# Patient Record
Sex: Female | Born: 1995 | Race: Black or African American | Hispanic: No | Marital: Single | Smoking: Never smoker
Health system: Southern US, Community
[De-identification: ages and names within clinical notes are randomized; demographics above are authoritative.]

## PROBLEM LIST (undated history)

## (undated) DIAGNOSIS — R569 Unspecified convulsions: Secondary | ICD-10-CM

## (undated) DIAGNOSIS — R519 Headache, unspecified: Secondary | ICD-10-CM

## (undated) DIAGNOSIS — Q796 Ehlers-Danlos syndrome, unspecified: Secondary | ICD-10-CM

## (undated) DIAGNOSIS — A749 Chlamydial infection, unspecified: Secondary | ICD-10-CM

## (undated) DIAGNOSIS — J45909 Unspecified asthma, uncomplicated: Secondary | ICD-10-CM

## (undated) DIAGNOSIS — M797 Fibromyalgia: Secondary | ICD-10-CM

## (undated) HISTORY — DX: Ehlers-Danlos syndrome, unspecified: Q79.60

## (undated) HISTORY — PX: TONSILLECTOMY: SUR1361

## (undated) HISTORY — DX: Chlamydial infection, unspecified: A74.9

---

## 2018-08-28 NOTE — L&D Delivery Note (Signed)
Delivery Note At 8:37 AM a viable female was delivered via Vaginal, Spontaneous (Presentation: ;  ).  APGAR: 8, 9; weight  .   Placenta status: intact with 3 vessel cord , Cord:  with the following complications: nuchal cord, loose, x 1  Anesthesia:   Epidural and local Episiotomy: None Lacerations: 2nd degree midline and bilateral 1st degree periurethral Suture Repair: 3.0 monocryl Est. Blood Loss (mL):    Mom to postpartum.  Baby to Couplet care / Skin to Skin (Patient said that she spoke with Peds about her COVID status and she opted to keep the baby with her).  Emily Filbert 02/13/2019, 9:01 AM

## 2019-02-11 ENCOUNTER — Other Ambulatory Visit: Payer: Self-pay

## 2019-02-11 ENCOUNTER — Inpatient Hospital Stay (EMERGENCY_DEPARTMENT_HOSPITAL)
Admission: AD | Admit: 2019-02-11 | Discharge: 2019-02-11 | Disposition: A | Discharge status: Home / Self Care | Payer: Medicare Other | Source: Home / Self Care | Attending: Family Medicine | Admitting: Family Medicine

## 2019-02-11 ENCOUNTER — Encounter (HOSPITAL_COMMUNITY): Payer: Self-pay | Admitting: *Deleted

## 2019-02-11 DIAGNOSIS — Z3A39 39 weeks gestation of pregnancy: Secondary | ICD-10-CM

## 2019-02-11 DIAGNOSIS — Z3A40 40 weeks gestation of pregnancy: Secondary | ICD-10-CM | POA: Insufficient documentation

## 2019-02-11 DIAGNOSIS — O471 False labor at or after 37 completed weeks of gestation: Secondary | ICD-10-CM | POA: Insufficient documentation

## 2019-02-11 DIAGNOSIS — O26893 Other specified pregnancy related conditions, third trimester: Secondary | ICD-10-CM | POA: Diagnosis not present

## 2019-02-11 DIAGNOSIS — O479 False labor, unspecified: Secondary | ICD-10-CM

## 2019-02-11 HISTORY — DX: Fibromyalgia: M79.7

## 2019-02-11 HISTORY — DX: Headache, unspecified: R51.9

## 2019-02-11 HISTORY — DX: Unspecified asthma, uncomplicated: J45.909

## 2019-02-11 HISTORY — DX: Unspecified convulsions: R56.9

## 2019-02-11 LAB — GROUP B STREP BY PCR: Group B strep by PCR: NEGATIVE

## 2019-02-11 LAB — URINALYSIS, ROUTINE W REFLEX MICROSCOPIC
Bilirubin Urine: NEGATIVE
Glucose, UA: NEGATIVE mg/dL
Ketones, ur: NEGATIVE mg/dL
Nitrite: NEGATIVE
Protein, ur: NEGATIVE mg/dL
Specific Gravity, Urine: 1.002 — ABNORMAL LOW (ref 1.005–1.030)
pH: 7 (ref 5.0–8.0)

## 2019-02-11 NOTE — MAU Note (Signed)
Pt reports to MAU via EMS c/o cramping every 10 min. No LOF. Pt reports only seeing some light brown discharge once when she wiped today. +FM. Pt from [place] reports she was here visiting. Pt reports that the people she was staying with last month had tested POS for COVID. Pt reports she had coughing then but none now. Pt states she took cough drops and another medication for coughing. Pt denies any other symptoms.

## 2019-02-11 NOTE — Discharge Instructions (Signed)
Braxton Hicks Contractions °Contractions of the uterus can occur throughout pregnancy, but they are not always a sign that you are in labor. You may have practice contractions called Braxton Hicks contractions. These false labor contractions are sometimes confused with true labor. °What are Braxton Hicks contractions? °Braxton Hicks contractions are tightening movements that occur in the muscles of the uterus before labor. Unlike true labor contractions, these contractions do not result in opening (dilation) and thinning of the cervix. Toward the end of pregnancy (32-34 weeks), Braxton Hicks contractions can happen more often and may become stronger. These contractions are sometimes difficult to tell apart from true labor because they can be very uncomfortable. You should not feel embarrassed if you go to the hospital with false labor. °Sometimes, the only way to tell if you are in true labor is for your health care provider to look for changes in the cervix. The health care provider will do a physical exam and may monitor your contractions. If you are not in true labor, the exam should show that your cervix is not dilating and your water has not broken. °If there are no other health problems associated with your pregnancy, it is completely safe for you to be sent home with false labor. You may continue to have Braxton Hicks contractions until you go into true labor. °How to tell the difference between true labor and false labor °True labor °· Contractions last 30-70 seconds. °· Contractions become very regular. °· Discomfort is usually felt in the top of the uterus, and it spreads to the lower abdomen and low back. °· Contractions do not go away with walking. °· Contractions usually become more intense and increase in frequency. °· The cervix dilates and gets thinner. °False labor °· Contractions are usually shorter and not as strong as true labor contractions. °· Contractions are usually irregular. °· Contractions  are often felt in the front of the lower abdomen and in the groin. °· Contractions may go away when you walk around or change positions while lying down. °· Contractions get weaker and are shorter-lasting as time goes on. °· The cervix usually does not dilate or become thin. °Follow these instructions at home: ° °· Take over-the-counter and prescription medicines only as told by your health care provider. °· Keep up with your usual exercises and follow other instructions from your health care provider. °· Eat and drink lightly if you think you are going into labor. °· If Braxton Hicks contractions are making you uncomfortable: °? Change your position from lying down or resting to walking, or change from walking to resting. °? Sit and rest in a tub of warm water. °? Drink enough fluid to keep your urine pale yellow. Dehydration may cause these contractions. °? Do slow and deep breathing several times an hour. °· Keep all follow-up prenatal visits as told by your health care provider. This is important. °Contact a health care provider if: °· You have a fever. °· You have continuous pain in your abdomen. °Get help right away if: °· Your contractions become stronger, more regular, and closer together. °· You have fluid leaking or gushing from your vagina. °· You pass blood-tinged mucus (bloody show). °· You have bleeding from your vagina. °· You have low back pain that you never had before. °· You feel your baby’s head pushing down and causing pelvic pressure. °· Your baby is not moving inside you as much as it used to. °Summary °· Contractions that occur before labor are   called Braxton Hicks contractions, false labor, or practice contractions.  Braxton Hicks contractions are usually shorter, weaker, farther apart, and less regular than true labor contractions. True labor contractions usually become progressively stronger and regular, and they become more frequent.  [facility] from [facility]  by changing position, resting in a warm bath, drinking plenty of water, or practicing deep breathing. This information is not intended to replace advice given to you by your health care provider. Make sure you discuss any questions you have with your health care provider. Document Released: 12/28/2016 Document Revised: 05/29/2017 Document Reviewed: 12/28/2016 Elsevier Interactive Patient Education  2019 [employer] Inc[employer]  Vaginal Delivery  Vaginal delivery means that you give birth by pushing your baby out of your birth canal (vagina). A team of health care providers will help you before, during, and after vaginal delivery. Birth experiences are unique for every woman and every pregnancy, and birth experiences vary depending on where you choose to give birth. What happens when I arrive at the birth center or hospital? Once you are in labor and have been admitted into the hospital or birth center, your health care provider may:  Review your pregnancy history and any concerns that you have.  Insert an IV into one of your veins. This may be used to give you fluids and medicines.  Check your blood pressure, pulse, temperature, and heart rate (vital signs).  Check whether your bag of water (amniotic sac) has broken (ruptured).  Talk with you about your birth plan and discuss pain control options. Monitoring Your health care provider may monitor your contractions (uterine monitoring) and your baby's heart rate (fetal monitoring). You may need to be monitored:  Often, but not continuously (intermittently).  All the time or for long periods at a time (continuously). Continuous monitoring may be needed if: ? You are taking certain medicines, such as medicine to relieve pain or make your contractions stronger. ? You have pregnancy or labor complications. Monitoring may be done by:  Placing a special stethoscope or a handheld monitoring device on your abdomen to check your baby's heartbeat and to  check for contractions.  Placing monitors on your abdomen (external monitors) to record your baby's heartbeat and the frequency and length of contractions.  Placing monitors inside your uterus through your vagina (internal monitors) to record your baby's heartbeat and the frequency, length, and strength of your contractions. Depending on the type of monitor, it may remain in your uterus or on your baby's head until birth.  Telemetry. This is a type of continuous monitoring that can be done with external or internal monitors. Instead of having to stay in bed, you are able to move around during telemetry. Physical exam Your health care provider may perform frequent physical exams. This may include:  Checking how and where your baby is positioned in your uterus.  Checking your cervix to determine: ? Whether it is thinning out (effacing). ? Whether it is opening up (dilating). What happens during labor and delivery?  Normal labor and delivery is divided into the following three stages: Stage 1  This is the longest stage of labor.  This stage can last for hours or days.  Throughout this stage, you will feel contractions. Contractions generally feel mild, infrequent, and irregular at first. They get stronger, more frequent (about every 2-3 minutes), and more regular as you move through this stage.  This stage ends when your cervix is completely dilated to 4 inches (10 cm) and completely effaced.  Stage 2  This stage starts once your cervix is completely effaced and dilated and lasts until the delivery of your baby.  This stage may last from 20 minutes to 2 hours.  This is the stage where you will feel an urge to push your baby out of your vagina.  You may feel stretching and burning pain, especially when the widest part of your baby's head passes through the vaginal opening (crowning).  Once your baby is delivered, the umbilical cord will be clamped and cut. This usually occurs after  waiting a period of 1-2 minutes after delivery.  Your baby will be placed on your bare chest (skin-to-skin contact) in an upright position and covered with a warm blanket. Watch your baby for feeding cues, like rooting or sucking, and help the baby to your breast for his or her first feeding. Stage 3  This stage starts immediately after the birth of your baby and ends after you deliver the placenta.  This stage may take anywhere from 5 to 30 minutes.  After your baby has been delivered, you will feel contractions as your body expels the placenta and your uterus contracts to control bleeding. What can I expect after labor and delivery?  After labor is over, you and your baby will be monitored closely until you are ready to go home to ensure that you are both healthy. Your health care team will teach you how to care for yourself and your baby.  You and your baby will stay in the same room (rooming in) during your hospital stay. This will encourage early bonding and successful breastfeeding.  You may continue to receive fluids and medicines through an IV.  Your uterus will be checked and massaged regularly (fundal massage).  You will have some soreness and pain in your abdomen, vagina, and the area of skin between your vaginal opening and your anus (perineum).  If an incision was made near your vagina (episiotomy) or if you had some vaginal tearing during delivery, cold compresses may be placed on your episiotomy or your tear. This helps to reduce pain and swelling.  You may be given a squirt bottle to use instead of wiping when you go to the bathroom. To use the squirt bottle, follow these steps: ? Before you urinate, fill the squirt bottle with warm water. Do not use hot water. ? After you urinate, while you are sitting on the toilet, use the squirt bottle to rinse the area around your urethra and vaginal opening. This rinses away any urine and blood. ? Fill the squirt bottle with clean  water every time you use the bathroom.  It is normal to have vaginal bleeding after delivery. Wear a sanitary pad for vaginal bleeding and discharge. Summary  Vaginal delivery means that you will give birth by pushing your baby out of your birth canal (vagina).  Your health care provider may monitor your contractions (uterine monitoring) and your baby's heart rate (fetal monitoring).  Your health care provider may perform a physical exam.  Normal labor and delivery is divided into three stages.  After labor is over, you and your baby will be monitored closely until you are ready to go home. This information is not intended to replace advice given to you by your health care provider. Make sure you discuss any questions you have with your health care provider. Document Released: 05/23/2008 Document Revised: 09/18/2017 Document Reviewed: 09/18/2017 Elsevier Interactive Patient Education  2019 [employer].  Fetal Movement Counts Patient  Name: ________________________________________________ Patient Due Date: ____________________ What is a fetal movement count?  A fetal movement count is the number of times that you feel your baby move during a certain amount of time. This may also be called a fetal kick count. A fetal movement count is recommended for every pregnant woman. You may be asked to start counting fetal movements as early as week 28 of your pregnancy. Pay attention to when your baby is most active. You may notice your baby's sleep and wake cycles. You may also notice things that make your baby move more. You should do a fetal movement count:  When your baby is normally most active.  At the same time each day. A good time to count movements is while you are resting, after having something to eat and drink. How do I count fetal movements? 1. Find a quiet, comfortable area. Sit, or lie down on your side. 2. Write down the date, the start time and stop time, and the number of  movements that you felt between those two times. Take this information with you to your health care visits. 3. For 2 hours, count kicks, flutters, swishes, rolls, and jabs. You should feel at least 10 movements during 2 hours. 4. You may stop counting after you have felt 10 movements. 5. If you do not feel 10 movements in 2 hours, have something to eat and drink. Then, keep resting and counting for 1 hour. If you feel at least 4 movements during that hour, you may stop counting. Contact a health care provider if:  You feel fewer than 4 movements in 2 hours.  Your baby is not moving like he or she usually does. Date: ____________ Start time: ____________ Stop time: ____________ Movements: ____________ Date: ____________ Start time: ____________ Stop time: ____________ Movements: ____________ Date: ____________ Start time: ____________ Stop time: ____________ Movements: ____________ Date: ____________ Start time: ____________ Stop time: ____________ Movements: ____________ Date: ____________ Start time: ____________ Stop time: ____________ Movements: ____________ Date: ____________ Start time: ____________ Stop time: ____________ Movements: ____________ Date: ____________ Start time: ____________ Stop time: ____________ Movements: ____________ Date: ____________ Start time: ____________ Stop time: ____________ Movements: ____________ Date: ____________ Start time: ____________ Stop time: ____________ Movements: ____________ This information is not intended to replace advice given to you by your health care provider. Make sure you discuss any questions you have with your health care provider. Document Released: 09/13/2006 Document Revised: 04/12/2016 Document Reviewed: 09/23/2015 Elsevier Interactive Patient Education  2019 [employer]

## 2019-02-11 NOTE — MAU Note (Signed)
I have communicated with Knute Neu CNM and reviewed vital signs:  Vitals:   02/11/19 2112 02/11/19 2159  BP: 133/71   Pulse: 75   Resp: 18   Temp: 98.5 F (36.9 C) 98.5 F (36.9 C)    Vaginal exam:  Dilation: 1.5 Effacement (%): 60 Station: -3 Exam by:: l.Claudy Abdallah rn,   Also reviewed contraction pattern and that non-stress test is reactive.  It has been documented that patient is not contracting.  No second cervical check needed.  Patient denies any other complaints.  Based on this report provider has given order for discharge.  A discharge order and diagnosis entered by a provider.   Labor discharge instructions reviewed with patient.     Pt advised to follow up with the clinic. Pt from [place] and plans to deliver here in [place]. Pt advised she needs to get a copy of her prenatals and fax them to the clinic. Pt given labor precautions. Provider notified of recent COVID exposure 1 month ago.

## 2019-02-11 NOTE — MAU Provider Note (Signed)
Sherry Colon is a 23 y.o. G1P0 female at [redacted]w[redacted]d visiting from out of town, although she plans to stay.  RN Labor check, not seen by provider SVE by RN: Dilation: 1.5 Effacement (%): 60 Station: -3 Exam by:: l.cox rn NST: FHR baseline 155 bpm, Variability: moderate, Accelerations:present, Decelerations:  Absent= Cat 1/Reactive Toco: irregular GBS, gc/ct collected  D/C home, message sent to clinic to schedule appt this week  [place], [facility] 02/11/2019 9:52 PM

## 2019-02-12 ENCOUNTER — Inpatient Hospital Stay (HOSPITAL_COMMUNITY)
Admission: EM | Admit: 2019-02-12 | Discharge: 2019-02-15 | DRG: 805 | Disposition: A | Discharge status: Home / Self Care | Payer: Medicare Other | Attending: Obstetrics and Gynecology | Admitting: Obstetrics and Gynecology

## 2019-02-12 ENCOUNTER — Encounter (HOSPITAL_COMMUNITY): Payer: Self-pay | Admitting: Anesthesiology

## 2019-02-12 ENCOUNTER — Inpatient Hospital Stay (HOSPITAL_COMMUNITY): Payer: Medicare Other | Admitting: Anesthesiology

## 2019-02-12 ENCOUNTER — Other Ambulatory Visit: Payer: Self-pay

## 2019-02-12 ENCOUNTER — Encounter: Payer: Self-pay | Admitting: Medical

## 2019-02-12 ENCOUNTER — Encounter (HOSPITAL_COMMUNITY): Payer: Self-pay | Admitting: *Deleted

## 2019-02-12 DIAGNOSIS — O98219 Gonorrhea complicating pregnancy, unspecified trimester: Secondary | ICD-10-CM | POA: Diagnosis present

## 2019-02-12 DIAGNOSIS — J45909 Unspecified asthma, uncomplicated: Secondary | ICD-10-CM | POA: Diagnosis present

## 2019-02-12 DIAGNOSIS — Z3689 Encounter for other specified antenatal screening: Secondary | ICD-10-CM | POA: Diagnosis not present

## 2019-02-12 DIAGNOSIS — G43909 Migraine, unspecified, not intractable, without status migrainosus: Secondary | ICD-10-CM | POA: Diagnosis present

## 2019-02-12 DIAGNOSIS — O98819 Other maternal infectious and parasitic diseases complicating pregnancy, unspecified trimester: Secondary | ICD-10-CM | POA: Diagnosis present

## 2019-02-12 DIAGNOSIS — O99354 Diseases of the nervous system complicating childbirth: Secondary | ICD-10-CM | POA: Diagnosis present

## 2019-02-12 DIAGNOSIS — Z3490 Encounter for supervision of normal pregnancy, unspecified, unspecified trimester: Secondary | ICD-10-CM

## 2019-02-12 DIAGNOSIS — O9852 Other viral diseases complicating childbirth: Secondary | ICD-10-CM | POA: Diagnosis present

## 2019-02-12 DIAGNOSIS — J988 Other specified respiratory disorders: Secondary | ICD-10-CM | POA: Diagnosis present

## 2019-02-12 DIAGNOSIS — Z3A40 40 weeks gestation of pregnancy: Secondary | ICD-10-CM

## 2019-02-12 DIAGNOSIS — A749 Chlamydial infection, unspecified: Secondary | ICD-10-CM | POA: Diagnosis present

## 2019-02-12 DIAGNOSIS — U071 COVID-19: Secondary | ICD-10-CM | POA: Insufficient documentation

## 2019-02-12 DIAGNOSIS — O9952 Diseases of the respiratory system complicating childbirth: Secondary | ICD-10-CM | POA: Diagnosis present

## 2019-02-12 DIAGNOSIS — O480 Post-term pregnancy: Secondary | ICD-10-CM | POA: Diagnosis not present

## 2019-02-12 DIAGNOSIS — O26893 Other specified pregnancy related conditions, third trimester: Secondary | ICD-10-CM | POA: Diagnosis present

## 2019-02-12 LAB — CBC
HCT: 35.7 % — ABNORMAL LOW (ref 36.0–46.0)
Hemoglobin: 11.6 g/dL — ABNORMAL LOW (ref 12.0–15.0)
MCH: 29.9 pg (ref 26.0–34.0)
MCHC: 32.5 g/dL (ref 30.0–36.0)
MCV: 92 fL (ref 80.0–100.0)
Platelets: 250 10*3/uL (ref 150–400)
RBC: 3.88 MIL/uL (ref 3.87–5.11)
RDW: 14.1 % (ref 11.5–15.5)
WBC: 7.3 10*3/uL (ref 4.0–10.5)
nRBC: 0 % (ref 0.0–0.2)

## 2019-02-12 LAB — ABO/RH: ABO/RH(D): B POS

## 2019-02-12 LAB — SARS CORONAVIRUS 2: SARS Coronavirus 2: DETECTED — AB

## 2019-02-12 LAB — TYPE AND SCREEN
ABO/RH(D): B POS
Antibody Screen: NEGATIVE

## 2019-02-12 LAB — GC/CHLAMYDIA PROBE AMP (~~LOC~~) NOT AT ARMC
Chlamydia: POSITIVE — AB
Neisseria Gonorrhea: POSITIVE — AB

## 2019-02-12 MED ORDER — EPHEDRINE 5 MG/ML INJ: 10.0000 mg | INTRAVENOUS | Status: DC | PRN

## 2019-02-12 MED ORDER — OXYTOCIN BOLUS FROM INFUSION
500.0000 mL | Freq: Once | INTRAVENOUS | Status: AC
  Administered 2019-02-13: 500 mL via INTRAVENOUS

## 2019-02-12 MED ORDER — LACTATED RINGERS IV SOLN: 500.0000 mL | INTRAVENOUS | Status: DC | PRN

## 2019-02-12 MED ORDER — PHENYLEPHRINE 40 MCG/ML (10ML) SYRINGE FOR IV PUSH (FOR BLOOD PRESSURE SUPPORT): 80.0000 ug | PREFILLED_SYRINGE | INTRAVENOUS | Status: DC | PRN

## 2019-02-12 MED ORDER — OXYCODONE-ACETAMINOPHEN 5-325 MG PO TABS: 2.0000 | ORAL_TABLET | ORAL | Status: DC | PRN

## 2019-02-12 MED ORDER — LACTATED RINGERS IV SOLN: 500.0000 mL | Freq: Once | INTRAVENOUS | Status: DC

## 2019-02-12 MED ORDER — LIDOCAINE HCL (PF) 1 % IJ SOLN
INTRAMUSCULAR | Status: DC | PRN
  Administered 2019-02-12 (×2): 5 mL via EPIDURAL

## 2019-02-12 MED ORDER — SODIUM CHLORIDE (PF) 0.9 % IJ SOLN
INTRAMUSCULAR | Status: DC | PRN
  Administered 2019-02-12: 12 mL/h via EPIDURAL

## 2019-02-12 MED ORDER — LACTATED RINGERS IV SOLN
INTRAVENOUS | Status: DC
  Administered 2019-02-12 – 2019-02-13 (×4): via INTRAVENOUS

## 2019-02-12 MED ORDER — SOD CITRATE-CITRIC ACID 500-334 MG/5ML PO SOLN: 30.0000 mL | ORAL | Status: DC | PRN

## 2019-02-12 MED ORDER — PRENATAL MULTIVITAMIN CH: 1.0000 | ORAL_TABLET | Freq: Every day | ORAL | Status: DC

## 2019-02-12 MED ORDER — FENTANYL-BUPIVACAINE-NACL 0.5-0.125-0.9 MG/250ML-% EP SOLN
12.0000 mL/h | EPIDURAL | Status: DC | PRN
  Filled 2019-02-12 (×2): qty 250

## 2019-02-12 MED ORDER — LIDOCAINE HCL (PF) 1 % IJ SOLN
30.0000 mL | INTRAMUSCULAR | Status: AC | PRN
  Administered 2019-02-13: 30 mL via SUBCUTANEOUS
  Filled 2019-02-12: qty 30

## 2019-02-12 MED ORDER — FENTANYL CITRATE (PF) 100 MCG/2ML IJ SOLN
50.0000 ug | INTRAMUSCULAR | Status: DC | PRN
  Administered 2019-02-12 (×2): 100 ug via INTRAVENOUS
  Filled 2019-02-12 (×2): qty 2

## 2019-02-12 MED ORDER — PHENYLEPHRINE 40 MCG/ML (10ML) SYRINGE FOR IV PUSH (FOR BLOOD PRESSURE SUPPORT)
80.0000 ug | PREFILLED_SYRINGE | INTRAVENOUS | Status: DC | PRN
  Filled 2019-02-12: qty 10

## 2019-02-12 MED ORDER — OXYTOCIN 40 UNITS IN NORMAL SALINE INFUSION - SIMPLE MED
2.5000 [IU]/h | INTRAVENOUS | Status: DC
  Filled 2019-02-12: qty 1000

## 2019-02-12 MED ORDER — OXYCODONE-ACETAMINOPHEN 5-325 MG PO TABS: 1.0000 | ORAL_TABLET | ORAL | Status: DC | PRN

## 2019-02-12 MED ORDER — ONDANSETRON HCL 4 MG/2ML IJ SOLN
4.0000 mg | Freq: Four times a day (QID) | INTRAMUSCULAR | Status: DC | PRN
  Administered 2019-02-12: 15:00:00 4 mg via INTRAVENOUS
  Filled 2019-02-12: qty 2

## 2019-02-12 MED ORDER — ACETAMINOPHEN 325 MG PO TABS: 650.0000 mg | ORAL_TABLET | ORAL | Status: DC | PRN

## 2019-02-12 NOTE — MAU Note (Signed)
Sherry Colon is a 23 y.o. at [redacted]w[redacted]d here in MAU reporting: contractions are every 4 minutes since this AM. Reporting some pink spotting, reporting LOF since 0600, states she sees clear watery discharge when she wipes. +FM  Onset of complaint: ongoing  Pain score: 10/10  Vitals:   02/12/19 1235  BP: 107/80  Pulse: 90  Resp: 18  Temp: 98.1 F (36.7 C)  SpO2: 100%     FHT:+ FM  Lab orders placed from triage: none

## 2019-02-12 NOTE — Progress Notes (Addendum)
Patient ID: Sherry Colon, female   DOB: 21-Aug-1996, 23 y.o.   MRN: [id number]  Mostly comfortable w/ epidural; having mild rectal pressure  BP 112/65, P 75, T 98.5 FHR 120s, +accels, no decels, Cat 1 Ctx q 5-7 mins, spont Cx per RN at 2114: 5/90/vtx -2  IUP@term  Ehlers-Danlos sx Early active labor COVID +  Plan to recheck cx 2-3 hrs after last exam Can augment with Pit/AROM prn Anticipate SVD  Myrtis Ser [facility] 02/12/2019

## 2019-02-12 NOTE — Anesthesia Preprocedure Evaluation (Signed)
Anesthesia Evaluation  Patient identified by MRN, date of birth, ID band Patient awake    Reviewed: Allergy & Precautions, NPO status , Patient's Chart, lab work & pertinent test results  History of Anesthesia Complications Negative for: history of anesthetic complications  Airway Mallampati: II  TM Distance: >3 FB Neck ROM: Full    Dental  (+) Dental Advisory Given   Pulmonary asthma (rarely needs inhaler) ,  02/12/2019 SARS coronavirus POSITIVE   breath sounds clear to auscultation       Cardiovascular negative cardio ROS   Rhythm:Regular Rate:Normal     Neuro/Psych  Headaches, Seizures - (remote history), Well Controlled,     GI/Hepatic Neg liver ROS, GERD  ,  Endo/Other  negative endocrine ROS  Renal/GU negative Renal ROS     Musculoskeletal  (+) Fibromyalgia -  Abdominal   Peds  Hematology plt 250k   Anesthesia Other Findings   Reproductive/Obstetrics (+) Pregnancy                             Anesthesia Physical Anesthesia Plan  ASA: III  Anesthesia Plan: Epidural   Post-op Pain Management:    Induction:   PONV Risk Score and Plan: 2 and Treatment may vary due to age or medical condition  Airway Management Planned: Natural Airway  Additional Equipment:   Intra-op Plan:   Post-operative Plan:   Informed Consent: I have reviewed the patients History and Physical, chart, labs and discussed the procedure including the risks, benefits and alternatives for the proposed anesthesia with the patient or authorized representative who has indicated his/her understanding and acceptance.     Dental advisory given  Plan Discussed with:   Anesthesia Plan Comments: (Patient identified. Risks/Benefits/Options discussed with patient including but not limited to bleeding, infection, nerve damage, paralysis, failed block, incomplete pain control, headache, blood pressure changes,  nausea, vomiting, reactions to medication both or allergic, itching and postpartum back pain. Confirmed with bedside nurse the patient's most recent platelet count. Confirmed with patient that they are not currently taking any anticoagulation, have any bleeding history or any family history of bleeding disorders. Patient expressed understanding and wished to proceed. All questions were answered. )        Anesthesia Quick Evaluation

## 2019-02-12 NOTE — Anesthesia Procedure Notes (Signed)
Epidural Patient location during procedure: OB Start time: 02/12/2019 8:00 PM End time: 02/12/2019 8:14 PM  Staffing Anesthesiologist: Annye Asa, MD Performed: anesthesiologist   Preanesthetic Checklist Completed: patient identified, surgical consent, pre-op evaluation, timeout performed, IV checked, risks and benefits discussed and monitors and equipment checked  Epidural Patient position: sitting Prep: site prepped and draped and DuraPrep Patient monitoring: blood pressure, continuous pulse ox and heart rate Approach: midline Location: L3-L4 Injection technique: LOR air  Needle:  Needle type: Tuohy  Needle gauge: 17 G Needle length: 9 cm Needle insertion depth: 5 cm Catheter type: closed end flexible Catheter size: 19 Gauge Catheter at skin depth: 10 cm Test dose: negative (1% lidocaine)  Assessment Events: blood not aspirated, injection not painful, no injection resistance, negative IV test and no paresthesia  Additional Notes Pt identified in Labor room.  Monitors applied. Working IV access confirmed. Sterile prep, drape lumbar spine.  1% lido local L 3,4.  #17ga Touhy LOR air at 5 cm L 3,4, cath in easily to 10 cm skin. Test dose OK, cath dosed and infusion begun.  Patient asymptomatic, VSS, no heme aspirated, tolerated well.  Jenita Seashore, MDReason for block:procedure for pain

## 2019-02-12 NOTE — H&P (Addendum)
Obstetric Admission History & Physical  Subjective Sherry Colon is a 23 y.o. female G1P0 with IUP at [redacted]w[redacted]d w/ EDD 6/17/0 by L+12 w U/S admitted for SOL.  Reports fetal movement. Denies vaginal bleeding and ROM. She received her prenatal care at [facility].  Support person in labor: Gregary Signs (sister)  Ultrasounds . Dating 12.5w . 09/19/18: anatomy - 260g, breech, anterior placenta, AAFV . 11/21/18: anatomy - 1117g, 24th %ile, Transverse - able to visulaize all except for aortic arch   Prenatal History/Complications: Marland Kitchen Migraines thought to be 2/2 amitryptiline withdrawal (to be restarted pp) . Ehlers Danlos syndrome, fibromyalgia, Raynaud's (Rx: procardia 30 qd, elavil 10mg  qhs (previously on trental 400mg  TID), ASA 162mg , no anticoagulation needed per MFM.  Marland Kitchen Asthma (Albuterol PRN.  Marland Kitchen Hx Anxiety depression - no medication  . Migraines - fioricet PRN, negative MRI on 11/05/18, was supposed to follow up with [facility] neurology   Past Medical History:  Diagnosis Date  . Asthma   . Fibromyalgia   . Headache   . Seizures ([place])    last seziure when she was in middle school   Past Surgical History:  Procedure Laterality Date  . TONSILLECTOMY     OB History    Gravida  1   Para      Term      Preterm      AB      Living        SAB      TAB      Ectopic      Multiple      Live Births             Social History   Socioeconomic History  . Marital status: Single    Spouse name: Not on file  . Number of children: Not on file  . Years of education: Not on file  . Highest education level: Not on file  Occupational History  . Not on file  Social Needs  . Financial resource strain: Not on file  . Food insecurity    Worry: Not on file    Inability: Not on file  . Transportation needs    Medical: Not on file    Non-medical: Not on file  Tobacco Use  . Smoking status: Never Smoker  . Smokeless tobacco: Never Used  Substance and Sexual Activity  . Alcohol use: Never     Frequency: Never  . Drug use: Never  . Sexual activity: Yes  Lifestyle  . Physical activity    Days per week: Not on file    Minutes per session: Not on file  . Stress: Not on file  Relationships  . Social Herbalist on phone: Not on file    Gets together: Not on file    Attends religious service: Not on file    Active member of club or organization: Not on file    Attends meetings of clubs or organizations: Not on file    Relationship status: Not on file  Other Topics Concern  . Not on file  Social History Narrative  . Not on file   Family History  Problem Relation Age of Onset  . Heart disease Mother   . Diabetes Mother   . Other Mother    Allergies: Allergies  Allergen Reactions  . Other     Pt reports having a allergy to a "pink pill" when she was little... pt unable to recall the medication name but states  it made her "turn colors"   Medications:  Current Meds  Medication Sig  . Prenatal Vit-Fe Fumarate-FA (PRENATAL MULTIVITAMIN) TABS tablet Take 1 tablet by mouth daily at 12 noon.    Review of Systems  All systems reviewed and negative except as stated in HPI  Objective Physical Exam:  BP 107/80 (BP Location: Right Arm)   Pulse 90   Temp 98.1 F (36.7 C) (Oral)   Resp 18   Ht 5\' 2"  (1.575 m)   Wt 69.9 kg   LMP 06/07/2018 (Approximate)   SpO2 100%   BMI 28.17 kg/m  General appearance: alert, cooperative, appears stated age and no distress Lungs: no respiratory distress Heart: RRR. No BLEE.  Abdomen: soft, non-tender; gravid  Pelvic: Deferred  Extremities: Moving spontaneously, warm, well perfused. 2+ DP. Uterine activity: Contractions Cervical Exam:  Dilation: 3.5 Effacement (%): 90 Station: -2 Exam by:: Doreatha MassedF. Morris, RNC Presentation: cephalic Fetal monitoring: baseline 140 / mod variability/ +a / -d  Prenatal labs: ABO, Rh:  B POS Antibody:  Neg Rubella:  immune RPR:   Pending HBsAg:   NR HIV:   neg GBS:    Negative Glucola: 1 hr 117 Genetic screening:  Not available   Prenatal Transfer Tool  Maternal Diabetes: No - unknown  Genetic Screening: Normal  - unknown  Maternal Ultrasounds/Referrals: Normal  - unknown  Fetal Ultrasounds or other Referrals:  None  - unknown  Maternal Substance Abuse:  No  - unknown  Significant Maternal Medications:  None  - unknown  Significant Maternal Lab Results: None  - unknown   Results for orders placed or performed during the hospital encounter of 02/11/19 (from the past 24 hour(s))  Urinalysis, Routine w reflex microscopic   Collection Time: 02/11/19  9:30 PM  Result Value Ref Range   Color, Urine STRAW (A) YELLOW   APPearance HAZY (A) CLEAR   Specific Gravity, Urine 1.002 (L) 1.005 - 1.030   pH 7.0 5.0 - 8.0   Glucose, UA NEGATIVE NEGATIVE mg/dL   Hgb urine dipstick MODERATE (A) NEGATIVE   Bilirubin Urine NEGATIVE NEGATIVE   Ketones, ur NEGATIVE NEGATIVE mg/dL   Protein, ur NEGATIVE NEGATIVE mg/dL   Nitrite NEGATIVE NEGATIVE   Leukocytes,Ua LARGE (A) NEGATIVE   RBC / HPF 11-20 0 - 5 RBC/hpf   WBC, UA 21-50 0 - 5 WBC/hpf   Bacteria, UA RARE (A) NONE SEEN   Squamous Epithelial / LPF 0-5 0 - 5   Trichomonas, UA PRESENT (A) NONE SEEN  Group B strep by PCR   Collection Time: 02/11/19  9:42 PM   Specimen: Urine, Clean Catch; Genital  Result Value Ref Range   Group B strep by PCR NEGATIVE NEGATIVE   Assessment & Plan:  Sherry Colon is a [age] y.o. G1P0 at 2715w0d - Spontaneous labor, progressing normally  Labor: Progressing normally  . Pain control: Epidural . Anticipated MOD: NSVD . PPH risk: low  . [facility] and in patient's chart   Fetal Wellbeing:  Category I tracing. . GBS:   Negative  . Continuous fetal monitoring  Postpartum Planning . Circ- outpatient / Breast & bottle / Contraception: depo (inpatient)   . R- pending incoming records, Tdap pending incoming records   . Social work: [place] training [place] education [place] to [place] will need help  obtaining resources in this area.   Genia Hotterachel Kim, M.D.  Family Medicine  PGY-1 02/12/2019 1:05 PM   OB FELLOW HISTORY AND PHYSICAL ATTESTATION  I have seen and examined this  patient; I agree with above documentation in the resident's note.   Marcy Sirenatherine Emelynn Rance, D.O. OB Fellow  02/12/2019, 5:19 PM

## 2019-02-13 ENCOUNTER — Encounter (HOSPITAL_COMMUNITY): Payer: Self-pay

## 2019-02-13 DIAGNOSIS — O98219 Gonorrhea complicating pregnancy, unspecified trimester: Secondary | ICD-10-CM | POA: Diagnosis present

## 2019-02-13 DIAGNOSIS — A749 Chlamydial infection, unspecified: Secondary | ICD-10-CM | POA: Diagnosis present

## 2019-02-13 DIAGNOSIS — O480 Post-term pregnancy: Secondary | ICD-10-CM

## 2019-02-13 DIAGNOSIS — Z3A40 40 weeks gestation of pregnancy: Secondary | ICD-10-CM

## 2019-02-13 LAB — RPR: RPR Ser Ql: NONREACTIVE

## 2019-02-13 MED ORDER — ZOLPIDEM TARTRATE 5 MG PO TABS: 5.0000 mg | ORAL_TABLET | Freq: Every evening | ORAL | Status: DC | PRN

## 2019-02-13 MED ORDER — SODIUM CHLORIDE 0.9 % IV SOLN: 250.0000 mL | INTRAVENOUS | Status: DC | PRN

## 2019-02-13 MED ORDER — LIDOCAINE HCL (PF) 1 % IJ SOLN
INTRAMUSCULAR | Status: AC
  Administered 2019-02-13: 17:00:00
  Filled 2019-02-13: qty 30

## 2019-02-13 MED ORDER — DIBUCAINE (PERIANAL) 1 % EX OINT: 1.0000 "application " | TOPICAL_OINTMENT | CUTANEOUS | Status: DC | PRN

## 2019-02-13 MED ORDER — SODIUM CHLORIDE 0.9% FLUSH: 3.0000 mL | INTRAVENOUS | Status: DC | PRN

## 2019-02-13 MED ORDER — MEASLES, MUMPS & RUBELLA VAC IJ SOLR: 0.5000 mL | Freq: Once | INTRAMUSCULAR | Status: DC

## 2019-02-13 MED ORDER — OXYTOCIN 40 UNITS IN NORMAL SALINE INFUSION - SIMPLE MED
1.0000 m[IU]/min | INTRAVENOUS | Status: DC
  Administered 2019-02-13: 4 m[IU]/min via INTRAVENOUS
  Administered 2019-02-13: 6 m[IU]/min via INTRAVENOUS
  Administered 2019-02-13: 04:00:00 2 m[IU]/min via INTRAVENOUS

## 2019-02-13 MED ORDER — ONDANSETRON HCL 4 MG/2ML IJ SOLN: 4.0000 mg | INTRAMUSCULAR | Status: DC | PRN

## 2019-02-13 MED ORDER — WITCH HAZEL-GLYCERIN EX PADS: 1.0000 "application " | MEDICATED_PAD | CUTANEOUS | Status: DC | PRN

## 2019-02-13 MED ORDER — CEFTRIAXONE SODIUM 500 MG IJ SOLR
250.0000 mg | Freq: Once | INTRAMUSCULAR | Status: AC
  Administered 2019-02-13: 250 mg via INTRAMUSCULAR
  Filled 2019-02-13: qty 500

## 2019-02-13 MED ORDER — TETANUS-DIPHTH-ACELL PERTUSSIS 5-2.5-18.5 LF-MCG/0.5 IM SUSP: 0.5000 mL | Freq: Once | INTRAMUSCULAR | Status: DC

## 2019-02-13 MED ORDER — PRENATAL MULTIVITAMIN CH
1.0000 | ORAL_TABLET | Freq: Every day | ORAL | Status: DC
  Administered 2019-02-13 – 2019-02-14 (×2): 1 via ORAL
  Filled 2019-02-13 (×2): qty 1

## 2019-02-13 MED ORDER — ACETAMINOPHEN 500 MG PO TABS
1000.0000 mg | ORAL_TABLET | Freq: Once | ORAL | Status: AC
  Administered 2019-02-13: 1000 mg via ORAL
  Filled 2019-02-13: qty 2

## 2019-02-13 MED ORDER — LIDOCAINE-EPINEPHRINE (PF) 2 %-1:200000 IJ SOLN
INTRAMUSCULAR | Status: DC | PRN
  Administered 2019-02-13 (×2): 5 mL via EPIDURAL

## 2019-02-13 MED ORDER — SODIUM CHLORIDE 0.9% FLUSH
3.0000 mL | Freq: Two times a day (BID) | INTRAVENOUS | Status: DC
  Administered 2019-02-13: 3 mL via INTRAVENOUS

## 2019-02-13 MED ORDER — ACETAMINOPHEN 325 MG PO TABS
650.0000 mg | ORAL_TABLET | ORAL | Status: DC | PRN
  Administered 2019-02-13 – 2019-02-14 (×4): 650 mg via ORAL
  Filled 2019-02-13 (×4): qty 2

## 2019-02-13 MED ORDER — SENNOSIDES-DOCUSATE SODIUM 8.6-50 MG PO TABS
2.0000 | ORAL_TABLET | ORAL | Status: DC
  Administered 2019-02-13 – 2019-02-14 (×2): 2 via ORAL
  Filled 2019-02-13 (×2): qty 2

## 2019-02-13 MED ORDER — COCONUT OIL OIL: 1.0000 "application " | TOPICAL_OIL | Status: DC | PRN

## 2019-02-13 MED ORDER — AZITHROMYCIN 250 MG PO TABS
1000.0000 mg | ORAL_TABLET | Freq: Once | ORAL | Status: AC
  Administered 2019-02-13: 1000 mg via ORAL
  Filled 2019-02-13: qty 4

## 2019-02-13 MED ORDER — SIMETHICONE 80 MG PO CHEW: 80.0000 mg | CHEWABLE_TABLET | ORAL | Status: DC | PRN

## 2019-02-13 MED ORDER — ALBUTEROL SULFATE HFA 108 (90 BASE) MCG/ACT IN AERS: 2.0000 | INHALATION_SPRAY | Freq: Four times a day (QID) | RESPIRATORY_TRACT | Status: DC | PRN

## 2019-02-13 MED ORDER — ONDANSETRON HCL 4 MG PO TABS: 4.0000 mg | ORAL_TABLET | ORAL | Status: DC | PRN

## 2019-02-13 MED ORDER — MEDROXYPROGESTERONE ACETATE 150 MG/ML IM SUSP: 150.0000 mg | INTRAMUSCULAR | Status: DC | PRN

## 2019-02-13 MED ORDER — BENZOCAINE-MENTHOL 20-0.5 % EX AERO
1.0000 "application " | INHALATION_SPRAY | CUTANEOUS | Status: DC | PRN
  Administered 2019-02-13: 1 via TOPICAL
  Filled 2019-02-13: qty 56

## 2019-02-13 MED ORDER — TERBUTALINE SULFATE 1 MG/ML IJ SOLN: 0.2500 mg | Freq: Once | INTRAMUSCULAR | Status: DC | PRN

## 2019-02-13 NOTE — Progress Notes (Signed)
Patient ID: Sherry Colon, female   DOB: [birth year], 23 y.o.   MRN: [id number]  Update from RN and chart reviewed; there is some concern re pt understanding about plan of care for self at d/c and infant due to pt's COVID+ status  BP 103/63, P 80, T 99.2 FHR 140s, +accels, no decels, Cat 1 Ctx q 2-6 mins, spont Cx 6/90/vtx -2 per RN exam at 0100  IUP@term  Active labor COVID+  Will continue teaching and reviewing pertinent COVID+ plan of care  [place] 02/13/2019 2:00 AM

## 2019-02-13 NOTE — Progress Notes (Signed)
Patient ID: Sherry Colon, female   DOB: 1995-09-28, 23 y.o.   MRN: [id number]  Update from RN and chart review:  Epidural recently redosed due to catheter disconnection; pt resting now and comfortable  BP 117/70, P 76, T 99.4 FHR 140-150s, +accels, no decels, occ mi variables Ctx q 1-4 mins with Pit at 67mu/min Cx was unchanged at 0411 (6/90/vtx -2) and so Pit was started for augmentation  IUP@40 .1wks Protracted active phase, due to inadequate ctx COVID+  Continue to titrate Pit to keep ctx reg; plan to recheck by 0800  Myrtis Ser CNM 02/13/2019

## 2019-02-13 NOTE — Discharge Summary (Addendum)
Postpartum Discharge Summary     Patient Name: Sherry Colon DOB: 03/12/1996 MRN: [id number]  Date of admission: 02/12/2019 Delivering Provider: Emily Filbert   Date of discharge: 02/15/2019  Admitting diagnosis: In labor Intrauterine pregnancy: [redacted]w[redacted]d     Secondary diagnosis:  Active Problems:   Intrauterine pregnancy   Gonorrhea affecting pregnancy   Chlamydia infection affecting pregnancy  Additional problems: COVID +     Discharge diagnosis: Term Pregnancy Delivered                                                                                                Post partum procedures:none  Augmentation: pitocin  Complications: None  Hospital course:  Onset of Labor With Vaginal Delivery     23 y.o. yo G1P0 at [redacted]w[redacted]d was admitted in Active Labor on 02/12/2019. Patient had an uncomplicated labor course as follows:  Membrane Rupture Time/Date: 8:05 AM ,02/13/2019   Intrapartum Procedures: Episiotomy: None [1]                                         Lacerations:  2nd degree [3];Perineal [11]  Patient had a delivery of a Viable infant. 02/13/2019  Information for the patient's newborn:  Sherry, Colon [[id number]]  Delivery Method: Vag-Spont     Pateint had an uncomplicated postpartum course.  She is ambulating, tolerating a regular diet, passing flatus, and urinating well. Patient is discharged home in stable condition on 02/15/19.   Magnesium Sulfate recieved: No BMZ received: No  Physical exam  Vitals:   02/14/19 1815 02/14/19 2001 02/14/19 2317 02/15/19 0459  BP: (!) 102/48 (!) 103/50 98/65 127/69  Pulse: 66 61 100 (!) 49  Resp:  18  18  Temp:  98.3 F (36.8 C)  97.9 F (36.6 C)  TempSrc:  Oral  Oral  SpO2:  99%  99%  Weight:      Height:       General: alert, cooperative and no distress Lochia: appropriate Uterine Fundus: firm Incision: N/A DVT Evaluation: No evidence of DVT seen on physical exam. Labs: Lab Results  Component Value Date   WBC 7.3  02/12/2019   HGB 11.6 (L) 02/12/2019   HCT 35.7 (L) 02/12/2019   MCV 92.0 02/12/2019   PLT 250 02/12/2019   No flowsheet data found.  Discharge instruction: per After Visit Summary and "Baby and Me Booklet".  After visit meds:  Allergies as of 02/15/2019      Reactions   Benadryl [diphenhydramine] Shortness Of Breath      Medication List    TAKE these medications   acetaminophen 325 MG tablet Commonly known as: Tylenol Take 2 tablets (650 mg total) by mouth every 4 (four) hours as needed (for pain scale < 4).   albuterol 108 (90 Base) MCG/ACT inhaler Commonly known as: VENTOLIN HFA Inhale 2 puffs into the lungs every 6 (six) hours as needed for wheezing or shortness of breath.   prenatal multivitamin Tabs tablet Take 1 tablet by mouth  daily at 12 noon.       Diet: No evidence of DVT seen on physical exam.  Activity: Advance as tolerated. Pelvic rest for 6 weeks.   Outpatient follow up:6 weeks, virtual Follow up Appt: Future Appointments  Date Time Provider Department Center  03/27/2019  3:35 PM Willodean RosenthalHarraway-Smith, Carolyn, MD [facility]  05/01/2019  1:30 PM [facility] NURSE [facility]   Follow up Visit: Follow-up Information    [facility]. Schedule an appointment as soon as possible for a visit in 1 month(s).   Specialty: Obstetrics and Gynecology Why: after baby visit Contact information: [address], [address] [id number] [place] [phone]           Please schedule this patient for Postpartum visit in: 6 weeks with the following provider: Any provider Low risk pregnancy complicated by: COVID Delivery mode:  SVD Anticipated Birth Control:  Depo Schedule Integrated BH visit: no      Newborn Data: Live born female  Birth Weight:   APGAR: 8, 9  Newborn Delivery   Birth date/time: 02/13/2019 08:37:00 Delivery type: Vaginal, Spontaneous      Baby Feeding: Bottle and  Breast Disposition:home with mother   02/15/2019 Sherry DarterJames , MD

## 2019-02-13 NOTE — Anesthesia Postprocedure Evaluation (Signed)
Anesthesia Post Note  Patient: Sherry Colon  Procedure(s) Performed: AN AD HOC LABOR EPIDURAL     Patient location during evaluation: Mother Baby Anesthesia Type: Epidural Level of consciousness: awake Pain management: satisfactory to patient Vital Signs Assessment: post-procedure vital signs reviewed and stable Respiratory status: spontaneous breathing Cardiovascular status: stable Anesthetic complications: no    Last Vitals:  Vitals:   02/13/19 1112 02/13/19 1120  BP:  136/73  Pulse:  72  Resp:  18  Temp: 36.9 C   SpO2:      Last Pain:  Vitals:   02/13/19 1112  TempSrc: Oral  PainSc:    Pain Goal: Patients Stated Pain Goal: 2 (02/13/19 1041)                 Casimer Lanius

## 2019-02-13 NOTE — Lactation Note (Signed)
This note was copied from a baby's chart. Lactation Consultation Note  Patient Name: Sherry Colon WGNFA'O Date: 02/13/2019 Reason for consult: Initial assessment;Primapara;1st time breastfeeding;Term  [place] visited [name] to assist with breast feeding. Her plan is to breast and bottle. She was sleeping upon entry, but baby was cueing in his bassinet. She woke up when I knocked and stated that baby was due to feed at 7, but she over slept.  [name] washed her hands and applied her mask. She had difficulty keeping the mask over her nose (it kept sliding down).  I first showed [name] how to hand express. We noted colostrum. I then assisted with latching baby on her left breast in football hold. Her breast tissue is pliable but somewhat thick in texture. Baby latched easily with a little stimulation of the filtrum.  [name] has never breast fed before and she needs assistance with hold and positioning. I educated on the benefits of breast milk, and encouraged her to feed baby on demand 8-12 times a day and wake to feed as needed. I educated on feeding cues and baby's output expectations for day one.  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 She has not transferred to [facility], and I offered to write a referral (which she accepted).  [name] does not have a breast pump at home. Day one and day two infant feeding patterns reviewed.  Maternal Data Formula Feeding for Exclusion: No Has patient been taught Hand Expression?: Yes Does the patient have breastfeeding experience prior to this delivery?: No  Feeding Feeding Type: Breast Fed  LATCH Score Latch: Grasps breast easily, tongue down, lips flanged, rhythmical sucking.  Audible Swallowing: A few with stimulation  Type of Nipple: Everted at rest and after stimulation  Comfort (Breast/Nipple): Soft / non-tender  Hold (Positioning): Assistance needed to  correctly position infant at breast and maintain latch.  LATCH Score: 8  Interventions Interventions: Breast feeding basics reviewed;Assisted with latch;Skin to skin;Hand express;Breast compression;Support pillows  Lactation Tools Discussed/Used WIC Program: Yes   Consult Status Consult Status: Follow-up Date: 02/14/19 Follow-up type: In-patient    Sherry Colon 02/13/2019, 7:58 PM

## 2019-02-14 MED ORDER — ACETAMINOPHEN 325 MG PO TABS: 650.0000 mg | ORAL_TABLET | ORAL | Status: DC | PRN

## 2019-02-14 NOTE — Lactation Note (Signed)
This note was copied from a baby's chart. Lactation Consultation Note  Patient Name: Boy Breanda Greenlaw SHUOH'F Date: 02/14/2019    Term baby at 27 hrs old P63 Mom, 3% weight loss.  +covid, asymptomatic  Mom breastfeeding and bottle feeding baby formula.  Latch scores of 8 recorded.  Spoke with Mother's nurse (Debbie Nix) and Mom is being encouraged to offer breast prior to formula feeding.  RN to ask Mom if she would like to have assistance from [facility] today.  To assist prn.    Broadus John 02/14/2019, 9:27 AM

## 2019-02-14 NOTE — Discharge Instructions (Signed)
Person Under Monitoring Name: Sherry Colon  Location: [address] [place]  [address]   Infection Prevention Recommendations for Individuals Confirmed to have, or Being Evaluated for, 2019 Novel Coronavirus (COVID-19) Infection Who Receive Care at Home  Individuals who are confirmed to have, or are being evaluated for, COVID-19 should follow the prevention steps below until a healthcare provider or local or state health department says they can return to normal activities.  Stay home except to get medical care You should restrict activities outside your home, except for getting medical care. Do not go to work, school, or public areas, and do not use public transportation or taxis.  Call ahead before visiting your doctor Before your medical appointment, call the healthcare provider and tell them that you have, or are being evaluated for, COVID-19 infection. This will help the healthcare providers office take steps to keep other people from getting infected. Ask your healthcare provider to call the local or state health department.  Monitor your symptoms Seek prompt medical attention if your illness is worsening (e.g., difficulty breathing). Before going to your medical appointment, call the healthcare provider and tell them that you have, or are being evaluated for, COVID-19 infection. Ask your healthcare provider to call the local or state health department.  Wear a facemask You should wear a facemask that covers your nose and mouth when you are in the same room with other people and when you visit a healthcare provider. People who live with or visit you should also wear a facemask while they are in the same room with you.  Separate yourself from other people in your home As much as possible, you should stay in a different room from other people in your home. Also, you should use a separate bathroom, if available.  Avoid sharing household items You should not  share dishes, drinking glasses, cups, eating utensils, towels, bedding, or other items with other people in your home. After using these items, you should wash them thoroughly with soap and water.  Cover your coughs and sneezes Cover your mouth and nose with a tissue when you cough or sneeze, or you can cough or sneeze into your sleeve. Throw used tissues in a lined trash can, and immediately wash your hands with soap and water for at least 20 seconds or use an alcohol-based hand rub.  Wash your [facility] your hands often and thoroughly with soap and water for at least 20 seconds. You can use an alcohol-based hand sanitizer if soap and water are not available and if your hands are not visibly dirty. Avoid touching your eyes, nose, and mouth with unwashed hands.   Prevention Steps for Caregivers and Household Members of Individuals Confirmed to have, or Being Evaluated for, COVID-19 Infection Being Cared for in the Home  If you live with, or provide care at home for, a person confirmed to have, or being evaluated for, COVID-19 infection please follow these guidelines to prevent infection:  Follow healthcare providers instructions Make sure that you understand and can help the patient follow any healthcare provider instructions for all care.  Provide for the patients basic needs You should help the patient with basic needs in the home and provide support for getting groceries, prescriptions, and other personal needs.  Monitor the patients symptoms If they are getting sicker, call his or her medical provider and tell them that the patient has, or is being evaluated for, COVID-19 infection. This will help the healthcare providers  office take steps to keep other people from getting infected. Ask the healthcare provider to call the local or state health department.  Limit the number of people who have contact with the patient  If possible, have only one caregiver for the  patient.  Other household members should stay in another home or place of residence. If this is not possible, they should stay  in another room, or be separated from the patient as much as possible. Use a separate bathroom, if available.  Restrict visitors who do not have an essential need to be in the home.  Keep older adults, very young children, and other sick people away from the patient Keep older adults, very young children, and those who have compromised immune systems or chronic health conditions away from the patient. This includes people with chronic heart, lung, or kidney conditions, diabetes, and cancer.  Ensure good ventilation Make sure that shared spaces in the home have good air flow, such as from an air conditioner or an opened window, weather permitting.  Wash your hands often  Wash your hands often and thoroughly with soap and water for at least 20 seconds. You can use an alcohol based hand sanitizer if soap and water are not available and if your hands are not visibly dirty.  Avoid touching your eyes, nose, and mouth with unwashed hands.  Use disposable paper towels to dry your hands. If not available, use dedicated cloth towels and replace them when they become wet.  Wear a facemask and gloves  Wear a disposable facemask at all times in the room and gloves when you touch or have contact with the patients blood, body fluids, and/or secretions or excretions, such as sweat, saliva, sputum, nasal mucus, vomit, urine, or feces.  Ensure the mask fits over your nose and mouth tightly, and do not touch it during use.  Throw out disposable facemasks and gloves after using them. Do not reuse.  Wash your hands immediately after removing your facemask and gloves.  If your personal clothing becomes contaminated, carefully remove clothing and launder. Wash your hands after handling contaminated clothing.  Place all used disposable facemasks, gloves, and other waste in a lined  container before disposing them with other household waste.  Remove gloves and wash your hands immediately after handling these items.  Do not share dishes, glasses, or other household items with the patient  Avoid sharing household items. You should not share dishes, drinking glasses, cups, eating utensils, towels, bedding, or other items with a patient who is confirmed to have, or being evaluated for, COVID-19 infection.  After the person uses these items, you should wash them thoroughly with soap and water.  Wash laundry thoroughly  Immediately remove and wash clothes or bedding that have blood, body fluids, and/or secretions or excretions, such as sweat, saliva, sputum, nasal mucus, vomit, urine, or feces, on them.  Wear gloves when handling laundry from the patient.  Read and follow directions on labels of laundry or clothing items and detergent. In general, wash and dry with the warmest temperatures recommended on the label.  Clean all areas the individual has used often  Clean all touchable surfaces, such as counters, tabletops, doorknobs, bathroom fixtures, toilets, phones, keyboards, tablets, and bedside tables, every day. Also, clean any surfaces that may have blood, body fluids, and/or secretions or excretions on them.  Wear gloves when cleaning surfaces the patient has come in contact with.  Use a diluted bleach solution (e.g., dilute bleach with 1  part bleach and 10 parts water) or a household disinfectant with a label that says EPA-registered for coronaviruses. To make a bleach solution at home, add 1 tablespoon of bleach to 1 quart (4 cups) of water. For a larger supply, add  cup of bleach to 1 gallon (16 cups) of water.  Read labels of cleaning products and follow recommendations provided on product labels. Labels contain instructions for safe and effective use of the cleaning product including precautions you should take when applying the product, such as wearing gloves or  eye protection and making sure you have good ventilation during use of the product.  Remove gloves and wash hands immediately after cleaning.  Monitor yourself for signs and symptoms of illness Caregivers and household members are considered close contacts, should monitor their health, and will be asked to limit movement outside of the home to the extent possible. Follow the monitoring steps for close contacts listed on the symptom monitoring form.   ? If you have additional questions, contact your local health department or call the epidemiologist on call at [phone] (available 24/7). ? This guidance is subject to change. For the most up-to-date guidance from [facility], please refer to their website: TripMetro.huhttps://www.cdc.gov/coronavirus/2019-ncov/hcp/guidance-prevent-spread.html    Vaginal Delivery, Care After Refer to this sheet in the next few weeks. These discharge instructions provide you with information on caring for yourself after delivery. Your caregiver may also give you specific instructions. Your treatment has been planned according to the most current medical practices available, but problems sometimes occur. Call your caregiver if you have any problems or questions after you go home. HOME CARE INSTRUCTIONS 1. Take over-the-counter or prescription medicines only as directed by your caregiver or pharmacist. 2. Do not drink alcohol, especially if you are breastfeeding or taking medicine to relieve pain. 3. Do not smoke tobacco. 4. Continue to use good perineal care. Good perineal care includes: 1. Wiping your perineum from back to front 2. Keeping your perineum clean. 3. You can do sitz baths twice a day, to help keep this area clean 5. Do not use tampons, douche or have sex until your caregiver says it is okay. 6. Shower only and avoid sitting in submerged water, aside from sitz baths 7. Wear a well-fitting bra that provides breast support. 8. Eat healthy foods. 9. Drink enough  fluids to keep your urine clear or pale yellow. 10. Eat high-fiber foods such as whole grain cereals and breads, brown rice, beans, and fresh fruits and vegetables every day. These foods may help prevent or relieve constipation. 11. Avoid constipation with high fiber foods or medications, such as miralax or metamucil 12. Follow your caregiver's recommendations regarding resumption of activities such as climbing stairs, driving, lifting, exercising, or traveling. 13. Talk to your caregiver about resuming sexual activities. Resumption of sexual activities is dependent upon your risk of infection, your rate of healing, and your comfort and desire to resume sexual activity. 14. Try to have someone help you with your household activities and your newborn for at least a few days after you leave the hospital. 15. Rest as much as possible. Try to rest or take a nap when your newborn is sleeping. 16. Increase your activities gradually. 17. Keep all of your scheduled postpartum appointments. It is very important to keep your scheduled follow-up appointments. At these appointments, your caregiver will be checking to make sure that you are healing physically and emotionally. SEEK MEDICAL CARE IF:   You are passing large clots from your vagina.  Save any clots to show your caregiver.  You have a foul smelling discharge from your vagina.  You have trouble urinating.  You are urinating frequently.  You have pain when you urinate.  You have a change in your bowel movements.  You have increasing redness, pain, or swelling near your vaginal incision (episiotomy) or vaginal tear.  You have pus draining from your episiotomy or vaginal tear.  Your episiotomy or vaginal tear is separating.  You have painful, hard, or reddened breasts.  You have a severe headache.  You have blurred vision or see spots.  You feel sad or depressed.  You have thoughts of hurting yourself or your newborn.  You have  questions about your care, the care of your newborn, or medicines.  You are dizzy or light-headed.  You have a rash.  You have nausea or vomiting.  You were breastfeeding and have not had a menstrual period within 12 weeks after you stopped breastfeeding.  You are not breastfeeding and have not had a menstrual period by the 12th week after delivery.  You have a fever. SEEK IMMEDIATE MEDICAL CARE IF:   You have persistent pain.  You have chest pain.  You have shortness of breath.  You faint.  You have leg pain.  You have stomach pain.  Your vaginal bleeding saturates two or more sanitary pads in 1 hour. MAKE SURE YOU:   Understand these instructions.  Will watch your condition.  Will get help right away if you are not doing well or get worse. Document Released: 08/11/2000 Document Revised: 12/29/2013 Document Reviewed: 04/10/2012 [facility] Patient Information 2015 [place], [place]. This information is not intended to replace advice given to you by your health care provider. Make sure you discuss any questions you have with your health care provider.  Sitz Bath A sitz bath is a warm water bath taken in the sitting position. The water covers only the hips and butt (buttocks). We recommend using one that fits in the toilet, to help with ease of use and cleanliness. It may be used for either healing or cleaning purposes. Sitz baths are also used to relieve pain, itching, or muscle tightening (spasms). The water may contain medicine. Moist heat will help you heal and relax.  HOME CARE  Take 3 to 4 sitz baths a day. 18. Fill the bathtub half-full with warm water. 19. Sit in the water and open the drain a little. 20. Turn on the warm water to keep the tub half-full. Keep the water running constantly. 21. Soak in the water for 15 to 20 minutes. 22. After the sitz bath, pat the affected area dry. GET HELP RIGHT AWAY IF: You get worse instead of better. Stop the sitz baths if you  get worse. MAKE SURE YOU:  Understand these instructions.  Will watch your condition.  Will get help right away if you are not doing well or get worse. Document Released: 09/21/2004 Document Revised: 05/08/2012 Document Reviewed: 12/12/2010 [facility] Patient Information 2015 [place]Care, [place]. This information is not intended to replace advice given to you by your health care provider. Make sure you discuss any questions you have with your health care provider.

## 2019-02-14 NOTE — Clinical Social Work Maternal (Addendum)
CLINICAL SOCIAL WORK MATERNAL/CHILD NOTE  Patient Details  Name: Sherry Colon MRN: [id number] Date of Birth: 02/19/1996  Date:  02/14/2019  Clinical Social Worker Initiating Note:  Janazia Schreier, LSCW     Date/Time: Initiated:  02/14/19/0906             Child's Name:      Biological Parents:  Mother, Father(Father: Veyon Leon)   Need for Interpreter:  None   Reason for Referral:  Other (Comment), Behavioral Health Concerns(New to the area, no prenatal care here;;)   Address:  [address] [place] [address]    Phone number:  [phone] (home)     Additional phone number:   Household Members/Support Persons (HM/SP):   Household Member/Support Person 1, Household Member/Support Person 2   HM/SP Name Relationship DOB or Age  HM/SP -1 Sherry Colon sister    HM/SP -2   Sister's FOB's mother    HM/SP -3        HM/SP -4        HM/SP -5        HM/SP -6        HM/SP -7        HM/SP -8          Natural Supports (not living in the home): Parent   Professional Supports:None   Employment:Unemployed   Type of Work:     Education:  High school graduate   Homebound arranged:    Financial Resources:Medicaid   Other Resources: WIC, Food Stamps (Working on getting WIC and Food Stamps transferred to [place] from [place])   Cultural/Religious Considerations Which May Impact Care:   Strengths: Ability to meet basic needs , Home prepared for child    Psychotropic Medications:         Pediatrician:       Pediatrician List:   [place]    [place]    [place]    [place]    [place]    [place]      Pediatrician Fax Number:    Risk Factors/Current Problems: None   Cognitive State: Alert , Able to Concentrate , Goal Oriented , Linear Thinking    Mood/Affect: Interested , Calm    CSW Assessment:02/13/19 CSW spoke with NP Campbell regarding MOB not having  prenatal records in her chart and requested UDS and CDS for infant. Per NP, MOB had good prenatal care according to MOB's H&P. UDS and CDS were not ordered.  02/14/19 CSW contacted MOB's room via telephone to conduct assessment. CSW introduced self and explained reason for consult. MOB was engaged throughout assessment. MOB reported that she resides with her sister and sister's FOB's mother. MOB reported that she is unemployed and receives both WIC and Food Stamps. MOB reported that she is working on transferring her WIC and Food Stamps to [place]. MOB reported that she has all items needed to care for infant, 2 car seats, crib, etc. CSW inquired about MOB's support system, MOB reported that her sister, mother and FOB's mother are her supports. MOB reported that FOB will be involved in infant's care. MOB reported that she received prenatal care at the [facility] and per infant's NP note MOB's prenatal care was " good. Established care at12 weeks at [facility]". MOB reported that she is still deciding on a OB provider in the area and also still deciding on a pediatrician for infant. MOB reported that she was provided with information on local pediatricians   and local OB GYN providers.   CSW inquired about MOB's mental health history, MOB denied any mental health history. MOB denied anxiety and depression diagnosis, MOB endorsed having a history of seizures. MOB reported that her last seizure was in middle school. CSW inquired about how MOB was currently feeling emotionally, MOB reported that she was okay but tired. CSW acknowledged and validated MOB's feelings. MOB seemed calm over the phone and did not verbalize any acute mental health signs/symptoms. CSW assessed for safety, MOB denied SI, HI and domestic violence.   CSW provided education regarding the baby blues period vs. perinatal mood disorders, discussed treatment and gave resources for mental health follow up if concerns arise.   CSW recommends self-evaluation during the postpartum time period using the New Mom Checklist from Postpartum Progress and encouraged MOB to contact a medical professional if symptoms are noted at any time.    CSW provided review of Sudden Infant Death Syndrome (SIDS) precautions. MOB reported that infant will sleep in a crib.   CSW inquired if MOB was interested parenting support programs being that she was new to the area for additional support. MOB declined and reported that she has the needed supports and resources to care for infant.     CSW identifies no further need for intervention and no barriers to discharge at this time.  CSW Plan/Description: No Further Intervention Required/No Barriers to Discharge, Sudden Infant Death Syndrome (SIDS) Education, Perinatal Mood and Anxiety Disorder (PMADs) Education    Areg Bialas L Arno Cullers, LCSW 02/14/2019, 9:12 AM           

## 2019-02-14 NOTE — Progress Notes (Signed)
Daily Post Partum Note  02/14/2019 Sherry Colon is a 23 y.o. G1P1001 PPD#1 s/p SVD/2nd degree and b/l peri-uretheral  @ [redacted]w[redacted]d  Pregnancy c/b asymptomatic covid+, EDS 24hr/overnight events:  none  Subjective:  No respiratory s/s and meeting all PP goals.   Objective:    Current Vital Signs 24h Vital Sign Ranges  T 98.4 F (36.9 C) Temp  Avg: 98.5 F (36.9 C)  Min: 98.2 F (36.8 C)  Max: 99 F (37.2 C)  BP 103/67 BP  Min: 103/67  Max: 123/67  HR 67 Pulse  Avg: 67.6  Min: 60  Max: 77  RR 17 Resp  Avg: 17  Min: 16  Max: 18  SaO2 100 % (Room air) SpO2  Avg: 99.9 %  Min: 99 %  Max: 100 %       24 Hour I/O Current Shift I/O  Time Ins Outs 06/18 0701 - 06/19 0700 In: -  Out: 19379[Urine:1200] No intake/output data recorded.    General: NAD Abdomen: nttp Perineum: deferred Respiratory:  Normal respiratory effort  Medications Current Facility-Administered Medications  Medication Dose Route Frequency Provider Last Rate Last Dose  . sodium chloride flush (NS) 0.9 % injection 3 mL  3 mL Intravenous Q12H Dove, Myra C, MD   3 mL at 02/13/19 2000   And  . sodium chloride flush (NS) 0.9 % injection 3 mL  3 mL Intravenous PRN Dove, Myra C, MD       And  . 0.9 %  sodium chloride infusion  250 mL Intravenous PRN Dove, Myra C, MD      . acetaminophen (TYLENOL) tablet 650 mg  650 mg Oral Q4H PRN Dove, Myra C, MD   650 mg at 02/14/19 1221  . albuterol (VENTOLIN HFA) 108 (90 Base) MCG/ACT inhaler 2 puff  2 puff Inhalation Q6H PRN Dove, Myra C, MD      . benzocaine-Menthol (DERMOPLAST) 20-0.5 % topical spray 1 application  1 application Topical PRN Dove, Myra C, MD   1 application at [id number]  . coconut oil  1 application Topical PRN Dove, Myra C, MD      . witch hazel-glycerin (TUCKS) pad 1 application  1 application Topical PRN Dove, Myra C, MD       And  . dibucaine (NUPERCAINAL) 1 % rectal ointment 1 application  1 application Rectal PRN Dove, Myra C, MD      . measles, mumps &  rubella vaccine (MMR) injection 0.5 mL  0.5 mL Subcutaneous Once Dove, Myra C, MD      . medroxyPROGESTERone (DEPO-PROVERA) injection 150 mg  150 mg Intramuscular Prior to discharge Dove, Myra C, MD      . ondansetron (ZOFRAN) tablet 4 mg  4 mg Oral Q4H PRN Dove, Myra C, MD       Or  . ondansetron (ZOFRAN) injection 4 mg  4 mg Intravenous Q4H PRN Dove, Myra C, MD      . prenatal multivitamin tablet 1 tablet  1 tablet Oral Q1200 Dove, Myra C, MD   1 tablet at 02/14/19 1221  . senna-docusate (Senokot-S) tablet 2 tablet  2 tablet Oral Q24H DClovia CuffC, MD   2 tablet at 02/13/19 2326  . simethicone (MYLICON) chewable tablet 80 mg  80 mg Oral PRN Dove, Myra C, MD      . Tdap (BOOSTRIX) injection 0.5 mL  0.5 mL Intramuscular Once Dove, Myra C, MD      . zolpidem (AMBIEN) tablet 5  mg  5 mg Oral QHS PRN Emily Filbert, MD        Labs:  Recent Labs  Lab 02/12/19 1325  WBC 7.3  HGB 11.6*  HCT 35.7*  PLT 250    Assessment & Plan:  Pt doing well *Postpartum/postop: okay for d/c to home today if okay for baby. *Dispo: desires pp visit with [place].   Conflict (See Lab Report): B POS/B POS Performed at [facility], [place] [address] [place], [place] [address]  / Charlynn Grimes Immune /  RPR negative / HIV negative / HepBsAg negative / pap needed / formula  / Contraception: depo before d/c / Circ: maybe outpatient/ Follow up: [facility]  Durene Romans MD Attending Center for [facility])

## 2019-02-15 NOTE — Lactation Note (Signed)
This note was copied from a baby's chart. Lactation Consultation Note  Patient Name: Boy Armonie Mettler OEHOZ'Y Date: 02/15/2019 Reason for consult: Follow-up assessment;Infant weight loss;Other (Comment)(Mom tested COVID (+))  27 hours old FT female who is now being mostly formula feed by his mother. Mom tested (+) for COVID-19, mom and baby are going home today. Reviewed discharged instructions, engorgement prevention and treatment and treatment/prevention for sore nipples. Mom told LC she didn't need any BF help at this time (called her room before entering) so all discharge instructions were given over the phone.   RN Juliann Pulse issued a hand pump and asked mom to make sure she takes it with her in case she gets engorged at some point. Reviewed supply and demand and encouraged to put to baby to the breast on feeding cues at least 8-12 times/24 hours before supplementing with Gerber Gentle. Mom reported all questions and concerns were answered, she's aware of [place] OP services and will contact PRN.  Maternal Data    Feeding    Interventions Interventions: Breast feeding basics reviewed  Lactation Tools Discussed/Used     Consult Status Consult Status: Complete Date: 02/15/19 Follow-up type: Call as needed    Kipton Skillen Francene Boyers 02/15/2019, 11:38 AM

## 2019-03-27 ENCOUNTER — Ambulatory Visit (INDEPENDENT_AMBULATORY_CARE_PROVIDER_SITE_OTHER): Payer: Medicaid Other | Admitting: Obstetrics & Gynecology

## 2019-03-27 ENCOUNTER — Other Ambulatory Visit: Payer: Self-pay

## 2019-03-27 ENCOUNTER — Encounter: Payer: Self-pay | Admitting: Obstetrics & Gynecology

## 2019-03-27 DIAGNOSIS — O99815 Abnormal glucose complicating the puerperium: Secondary | ICD-10-CM

## 2019-03-27 DIAGNOSIS — Z3009 Encounter for other general counseling and advice on contraception: Secondary | ICD-10-CM

## 2019-03-27 NOTE — Progress Notes (Signed)
TELEHEALTH POSTPARTUM VIRTUAL VIDEO VISIT ENCOUNTER NOTE   Provider location: Center for [employer] at [facility]   I connected with Sherry Colon on 03/27/19 at  3:35 PM EDT by MyChart Video Encounter at home and verified that I am speaking with the correct person using two identifiers.    I discussed the limitations, risks, security and privacy concerns of performing an evaluation and management service virtually and the availability of in person appointments. I also discussed with the patient that there may be a patient responsible charge related to this service. The patient expressed understanding and agreed to proceed.  Chief Complaint: Postpartum Visit  History of Present Illness: Sherry Colon is a 23 y.o. African-American G1P1001 being evaluated for postpartum followup.    She is s/p spontaneous vaginal delivery on June 18 at 40 weeks; she was discharged to home on June 20. Pregnancy complicated by intermittent HA.   Baby is doing well both bottle and breast feeding.  Vaginal bleeding or discharge: No  Intercourse: Yes  Contraception: condoms, is interested in Depo Mode of feeding infant: Bottle and Breast PP depression s/s: No .  Any bowel or bladder issues: Yes    Review of Systems: Her 12 point review of systems is negative or as noted in the History of Present Illness.  Patient Active Problem List   Diagnosis Date Noted  . Gonorrhea affecting pregnancy 02/13/2019  . Chlamydia infection affecting pregnancy 02/13/2019  . Intrauterine pregnancy 02/12/2019  . COVID-19 virus detected 02/12/2019    Medications Mayerli Shawley had no medications administered during this visit. Current Outpatient Medications  Medication Sig Dispense Refill  . albuterol (VENTOLIN HFA) 108 (90 Base) MCG/ACT inhaler Inhale 2 puffs into the lungs every 6 (six) hours as needed for wheezing or shortness of breath.    . Prenatal Vit-Fe Fumarate-FA (PRENATAL MULTIVITAMIN) TABS tablet  Take 1 tablet by mouth daily at 12 noon.     No current facility-administered medications for this visit.     Allergies Benadryl [diphenhydramine]  Physical Exam:  Breastfeeding Yes  Reviewed from Babyscripts General:  Alert, oriented and cooperative. Patient is in no acute distress.  Mental Status: Normal mood and affect. Normal behavior. Normal judgment and thought content.   Respiratory: Normal respiratory effort noted, no problems with respiration noted  Rest of physical exam deferred due to type of encounter  PP Depression Screening:   Edinburgh Postnatal Depression Scale Screening Tool 03/27/2019 02/14/2019 02/13/2019  I have been able to laugh and see the funny side of things. 0 0 0  I have looked forward with enjoyment to things. 0 0 0  I have blamed myself unnecessarily when things went wrong. 0 3 3  I have been anxious or worried for no good reason. 0 2 2  I have felt scared or panicky for no good reason. 0 0 0  Things have been getting on top of me. 0 0 0  I have been so unhappy that I have had difficulty sleeping. 0 2 2  I have felt sad or miserable. 0 1 1  I have been so unhappy that I have been crying. 0 1 1  The thought of harming myself has occurred to me. 0 0 0  Edinburgh Postnatal Depression Scale Total 0 9 9     Assessment:Patient is a 23 y.o. G1P1001 who is 40 weeks postpartum from a spontaneous vaginal delivery.  She is doing very well. She desires Depo Provera.   Plan: Depo Provera  150mg  Im for contraception  RTC as soon as possible for Depo. Pt is sexually active with no contraception at present. I have explained to her that she can get pregnant.   I discussed the assessment and treatment plan with the patient. The patient was provided an opportunity to ask questions and all were answered. The patient agreed with the plan and demonstrated an understanding of the instructions.   The patient was advised to call back or seek an in-person evaluation/go to the ED  for any concerning postpartum symptoms.  I provided 10 minutes of face-to-face time during this encounter.   Willodean Rosenthalarolyn Harraway-Smith, MD [facility], [facility]

## 2019-04-15 ENCOUNTER — Telehealth: Payer: Self-pay | Admitting: Family Medicine

## 2019-04-15 NOTE — Telephone Encounter (Signed)
Attempted to call patient about her appointment. We had to changes in the office. Was not able to leave a message.

## 2019-05-01 ENCOUNTER — Ambulatory Visit: Payer: Medicare Other

## 2019-05-07 ENCOUNTER — Ambulatory Visit: Payer: Medicaid Other

## 2021-02-15 ENCOUNTER — Other Ambulatory Visit: Payer: Self-pay

## 2021-02-15 ENCOUNTER — Ambulatory Visit
Admission: EM | Admit: 2021-02-15 | Discharge: 2021-02-15 | Disposition: A | Discharge status: Home / Self Care | Payer: Medicare Other

## 2021-02-21 ENCOUNTER — Other Ambulatory Visit: Payer: Self-pay

## 2021-02-21 ENCOUNTER — Ambulatory Visit: Payer: Medicare Other

## 2021-02-21 ENCOUNTER — Telehealth: Payer: Self-pay

## 2021-02-21 ENCOUNTER — Ambulatory Visit
Admission: EM | Admit: 2021-02-21 | Discharge: 2021-02-21 | Disposition: A | Discharge status: Home / Self Care | Payer: Medicare Other | Attending: Emergency Medicine | Admitting: Emergency Medicine

## 2021-02-21 DIAGNOSIS — J45909 Unspecified asthma, uncomplicated: Secondary | ICD-10-CM | POA: Insufficient documentation

## 2021-02-21 DIAGNOSIS — Z3201 Encounter for pregnancy test, result positive: Secondary | ICD-10-CM | POA: Diagnosis present

## 2021-02-21 DIAGNOSIS — R809 Proteinuria, unspecified: Secondary | ICD-10-CM | POA: Insufficient documentation

## 2021-02-21 LAB — POCT URINALYSIS DIP (MANUAL ENTRY)
Bilirubin, UA: NEGATIVE
Blood, UA: NEGATIVE
Glucose, UA: NEGATIVE mg/dL
Ketones, POC UA: NEGATIVE mg/dL
Leukocytes, UA: NEGATIVE
Nitrite, UA: NEGATIVE
Spec Grav, UA: 1.02 (ref 1.010–1.025)
Urobilinogen, UA: 1 E.U./dL
pH, UA: 7.5 (ref 5.0–8.0)

## 2021-02-21 LAB — POCT URINE PREGNANCY: Preg Test, Ur: POSITIVE — AB

## 2021-02-21 MED ORDER — PRENATAL COMPLETE 14-0.4 MG PO TABS: 1.0000 | ORAL_TABLET | Freq: Every day | ORAL | 0 refills | Status: AC

## 2021-02-21 NOTE — ED Triage Notes (Signed)
Pt states had a positive pregnancy test on 6/24. States missed her OBGYN appt today. Pt c/o fatigue and urinary frequency.

## 2021-02-21 NOTE — Discharge Instructions (Addendum)
The prenatal vitamins.  Follow-up with [facility] or [facility] women[facility] care as soon as possible.  Go to the maternity admission units for any of the signs and symptoms we discussed

## 2021-02-21 NOTE — ED Provider Notes (Signed)
HPI  SUBJECTIVE:  Sherry Colon is a G2, P44 25 y.o. female who presents for pregnancy testing.  she had a positive home pregnancy 3 days ago.  She reports urinary frequency, feeling sleepy, moody, and has specific food cravings which she states she had with her previous pregnancy.  She has no other urinary  or vaginal complaints, back, abdominal, pelvic pain.  Needs proof of pregnancy in order to establish care at [facility].  She has a past medical history of seizures, asthma.  No history of UTI, pyelonephritis, ectopic pregnancy, PID.  LMP: May.  She states that she is normally very regular.  PMD: None.  Past Medical History:  Diagnosis Date   Asthma    Fibromyalgia    Headache    Seizures (HCC)    last seziure when she was in middle school    Past Surgical History:  Procedure Laterality Date   TONSILLECTOMY      Family History  Problem Relation Age of Onset   Heart disease Mother    Diabetes Mother    Other Mother     Social History   Tobacco Use   Smoking status: Never   Smokeless tobacco: Never  Vaping Use   Vaping Use: Never used  Substance Use Topics   Alcohol use: Never   Drug use: Never    No current facility-administered medications for this encounter.  Current Outpatient Medications:    Prenatal Vit-Fe Fumarate-FA (PRENATAL COMPLETE) 14-0.4 MG TABS, Take 1 tablet by mouth daily., Disp: 30 tablet, Rfl: 0   albuterol (VENTOLIN HFA) 108 (90 Base) MCG/ACT inhaler, Inhale 2 puffs into the lungs every 6 (six) hours as needed for wheezing or shortness of breath., Disp: , Rfl:   Allergies  Allergen Reactions   Benadryl [Diphenhydramine] Shortness Of Breath     ROS  As noted in HPI.   Physical Exam  BP 117/60 (BP Location: Left Arm)   Pulse 79   Temp 98 F (36.7 C) (Oral)   Resp 18   LMP 01/18/2021   SpO2 98%   Constitutional: Well developed, well nourished, no acute distress Eyes:  EOMI, conjunctiva normal bilaterally HENT: Normocephalic,  atraumatic,mucus membranes moist Respiratory: Normal inspiratory effort Cardiovascular: Normal rate GI: nondistended soft, nontender. Back: No CVAT skin: No rash, skin intact Musculoskeletal: no deformities Neurologic: Alert & oriented x 3, no focal neuro deficits Psychiatric: Speech and behavior appropriate   ED Course   Medications - No data to display  Orders Placed This Encounter  Procedures   Urine Culture    Standing Status:   Standing    Number of Occurrences:   1   POCT urine pregnancy    Standing Status:   Standing    Number of Occurrences:   1   POCT urinalysis dipstick    Standing Status:   Standing    Number of Occurrences:   1    No results found for this or any previous visit (from the past 24 hour(s)).  No results found.  ED Clinical Impression  1. Positive urine pregnancy test      ED Assessment/Plan  Patient's urine pregnancy positive.  She has cloudy urine, trace proteinuria, but no nitrites or esterase.  She has no vaginal complaints or urinary complaints other than some frequency.  Will not treat this today, however will send off for culture to confirm absence of bacteriuria.  Advised her to increase fluid intake.  Will start on some prenatal vitamins.  She will need to  follow-up with [facility] or may establish care at [facility] women's ASAP. Discussed labs, MDM, treatment plan, and plan for follow-up with patient. Discussed sn/sx that should prompt return to the ED. patient agrees with plan.   Meds ordered this encounter  Medications   Prenatal Vit-Fe Fumarate-FA (PRENATAL COMPLETE) 14-0.4 MG TABS    Sig: Take 1 tablet by mouth daily.    Dispense:  30 tablet    Refill:  0      *This clinic note was created using Scientist, clinical (histocompatibility and immunogenetics). Therefore, there may be occasional mistakes despite careful proofreading.  ?    Domenick Gong, MD 02/22/21 [phone]

## 2021-02-23 LAB — URINE CULTURE

## 2021-03-23 ENCOUNTER — Ambulatory Visit (INDEPENDENT_AMBULATORY_CARE_PROVIDER_SITE_OTHER): Payer: Medicare Other

## 2021-03-23 ENCOUNTER — Other Ambulatory Visit: Payer: Self-pay

## 2021-03-23 VITALS — BP 114/80 | HR 73 | Ht 62.0 in | Wt 119.5 lb

## 2021-03-23 DIAGNOSIS — Z3481 Encounter for supervision of other normal pregnancy, first trimester: Secondary | ICD-10-CM | POA: Diagnosis not present

## 2021-03-23 DIAGNOSIS — Z3A09 9 weeks gestation of pregnancy: Secondary | ICD-10-CM

## 2021-03-23 DIAGNOSIS — Z3480 Encounter for supervision of other normal pregnancy, unspecified trimester: Secondary | ICD-10-CM | POA: Insufficient documentation

## 2021-03-23 DIAGNOSIS — O3680X0 Pregnancy with inconclusive fetal viability, not applicable or unspecified: Secondary | ICD-10-CM

## 2021-03-23 DIAGNOSIS — Z3491 Encounter for supervision of normal pregnancy, unspecified, first trimester: Secondary | ICD-10-CM | POA: Insufficient documentation

## 2021-03-23 DIAGNOSIS — O219 Vomiting of pregnancy, unspecified: Secondary | ICD-10-CM

## 2021-03-23 MED ORDER — PROMETHAZINE HCL 25 MG PO TABS: 25.0000 mg | ORAL_TABLET | Freq: Four times a day (QID) | ORAL | 2 refills | Status: DC | PRN

## 2021-03-23 MED ORDER — BLOOD PRESSURE KIT DEVI: 1.0000 | 0 refills | Status: DC

## 2021-03-23 MED ORDER — GOJJI WEIGHT SCALE MISC: 1.0000 | 0 refills | Status: DC

## 2021-03-23 NOTE — Progress Notes (Signed)
Agree with A & P. 

## 2021-03-23 NOTE — Progress Notes (Signed)
New OB Intake  I connected with  [facility] Nurse on 03/23/21 at  1:15 PM EDT by in person. Video Visit and verified that I am speaking with the correct person using two identifiers. Nurse is located at [facility] and pt is located at [facility].  I discussed the limitations, risks, security and privacy concerns of performing an evaluation and management service by telephone and the availability of in person appointments. I also discussed with the patient that there may be a patient responsible charge related to this service. The patient expressed understanding and agreed to proceed.  I explained I am completing New OB Intake today. We discussed her EDD of 10/24/21 that is based on early u/s. Pt is G2/P1001. I reviewed her allergies, medications, Medical/Surgical/OB history, and appropriate screenings. I informed her of [facility] services. Based on history, this is a/an  pregnancy uncomplicated .   Patient Active Problem List   Diagnosis Date Noted   Encounter for supervision of normal pregnancy in first trimester 03/23/2021   Gonorrhea affecting pregnancy 02/13/2019   Chlamydia infection affecting pregnancy 02/13/2019   Intrauterine pregnancy 02/12/2019   COVID-19 virus detected 02/12/2019    Concerns addressed today  Delivery Plans:  Plans to deliver at [facility].   MyChart/Babyscripts MyChart access verified. I explained pt will have some visits in office and some virtually. Babyscripts instructions given and order placed. Patient verifies receipt of registration text/e-mail. Account successfully created and app downloaded.  Blood Pressure Cuff  Blood pressure cuff ordered for patient to pick-up from [employer]. Explained after first prenatal appt pt will check weekly and document in Babyscripts.  Weight scale: Patient does not have weight scale. Weight scale ordered for patient to pick up form [facility].   Anatomy US Explained first scheduled [place] will be around 19 weeks. Dating and  viability scan performed today.  Labs Discussed Avelina Laine genetic screening with patient. Would like both Panorama and Horizon drawn at new OB visit. Routine prenatal labs needed.  Covid Vaccine Patient has covid vaccine.   Mother/ Baby Dyad Candidate?    If yes, offer as possibility  Informed patient of [facility] website  and placed link in her AVS.   Social Determinants of Health Food Insecurity: Patient denies food insecurity. WIC Referral: Patient is interested in referral to [facility].  Transportation: Patient denies transportation needs. Childcare: Discussed no children allowed at ultrasound appointments. Offered childcare services; patient declines childcare services at this time.   Placed OB Box on problem list and updated  First visit review I reviewed new OB appt with pt. I explained she will have a pelvic exam, ob bloodwork with genetic screening, and PAP smear. Explained pt will be seen by Donia Ast at first visit; encounter routed to appropriate provider. Explained that patient will be seen by pregnancy navigator following visit with provider. [facility] information placed in AVS.   Hamilton Capri, RN 03/23/2021  1:46 PM

## 2021-03-30 ENCOUNTER — Encounter: Payer: Medicare Other | Admitting: Women's Health

## 2021-05-04 ENCOUNTER — Other Ambulatory Visit: Payer: Self-pay | Admitting: *Deleted

## 2021-05-04 ENCOUNTER — Ambulatory Visit (INDEPENDENT_AMBULATORY_CARE_PROVIDER_SITE_OTHER): Payer: Medicare Other | Admitting: Medical

## 2021-05-04 ENCOUNTER — Other Ambulatory Visit: Payer: Self-pay

## 2021-05-04 ENCOUNTER — Other Ambulatory Visit (HOSPITAL_COMMUNITY)
Admission: RE | Admit: 2021-05-04 | Discharge: 2021-05-04 | Disposition: A | Discharge status: Home / Self Care | Payer: Medicare Other | Source: Ambulatory Visit | Attending: Medical | Admitting: Medical

## 2021-05-04 ENCOUNTER — Encounter: Payer: Self-pay | Admitting: Medical

## 2021-05-04 VITALS — BP 104/69 | HR 72 | Wt 129.4 lb

## 2021-05-04 DIAGNOSIS — R569 Unspecified convulsions: Secondary | ICD-10-CM

## 2021-05-04 DIAGNOSIS — Z3482 Encounter for supervision of other normal pregnancy, second trimester: Secondary | ICD-10-CM | POA: Diagnosis present

## 2021-05-04 DIAGNOSIS — M797 Fibromyalgia: Secondary | ICD-10-CM

## 2021-05-04 DIAGNOSIS — Z124 Encounter for screening for malignant neoplasm of cervix: Secondary | ICD-10-CM | POA: Diagnosis not present

## 2021-05-04 DIAGNOSIS — Z3481 Encounter for supervision of other normal pregnancy, first trimester: Secondary | ICD-10-CM | POA: Diagnosis not present

## 2021-05-04 DIAGNOSIS — O99512 Diseases of the respiratory system complicating pregnancy, second trimester: Secondary | ICD-10-CM

## 2021-05-04 DIAGNOSIS — Z3A15 15 weeks gestation of pregnancy: Secondary | ICD-10-CM

## 2021-05-04 DIAGNOSIS — J45909 Unspecified asthma, uncomplicated: Secondary | ICD-10-CM

## 2021-05-04 DIAGNOSIS — Z3689 Encounter for other specified antenatal screening: Secondary | ICD-10-CM

## 2021-05-04 DIAGNOSIS — A749 Chlamydial infection, unspecified: Secondary | ICD-10-CM

## 2021-05-04 DIAGNOSIS — O98812 Other maternal infectious and parasitic diseases complicating pregnancy, second trimester: Secondary | ICD-10-CM

## 2021-05-04 MED ORDER — ALBUTEROL SULFATE HFA 108 (90 BASE) MCG/ACT IN AERS: 2.0000 | INHALATION_SPRAY | Freq: Four times a day (QID) | RESPIRATORY_TRACT | 3 refills | Status: DC | PRN

## 2021-05-04 NOTE — Progress Notes (Signed)
New OB 15.[redacted]wks GA OB panel, urine culture, GC/CC today. No PAP since last baby. Genetic screening offered and accepted. PHQ 9, GAD 7 completed at intake, negative.

## 2021-05-04 NOTE — Patient Instructions (Signed)
Safe Medications in Pregnancy   Acne:  Benzoyl Peroxide  Salicylic Acid   Backache/Headache:  Tylenol: 2 regular strength every 4 hours OR               2 Extra strength every 6 hours   Colds/Coughs/Allergies:  Benadryl (alcohol free) 25 mg every 6 hours as needed  Breath right strips  Claritin  Cepacol throat lozenges  Chloraseptic throat spray  Cold-Eeze- up to three times per day  Cough drops, alcohol free  Flonase (by prescription only)  Guaifenesin  Mucinex  Robitussin DM (plain only, alcohol free)  Saline nasal spray/drops  Sudafed (pseudoephedrine) & Actifed * use only after [redacted] weeks gestation and if you do not have high blood pressure  Tylenol  Vicks Vaporub  Zinc lozenges  Zyrtec   Constipation:  Colace  Ducolax suppositories  Fleet enema  Glycerin suppositories  Metamucil  Milk of magnesia  Miralax  Senokot  Smooth move tea   Diarrhea:  Kaopectate  Imodium A-D   *NO pepto Bismol   Hemorrhoids:  Anusol  Anusol HC  Preparation H  Tucks   Indigestion:  Tums  Maalox  Mylanta  Zantac  Pepcid   Insomnia:  Benadryl (alcohol free) 25mg  every 6 hours as needed  Tylenol PM  Unisom, no Gelcaps   Leg Cramps:  Tums  MagGel   Nausea/Vomiting:  Bonine  Dramamine  Emetrol  Ginger extract  Sea bands  Meclizine  Nausea medication to take during pregnancy:  Unisom (doxylamine succinate 25 mg tablets) Take one tablet daily at bedtime. If symptoms are not adequately controlled, the dose can be increased to a maximum recommended dose of two tablets daily (1/2 tablet in the morning, 1/2 tablet mid-afternoon and one at bedtime).  Vitamin B6 100mg  tablets. Take one tablet twice a day (up to 200 mg per day).   Skin Rashes:  Aveeno products  Benadryl cream or 25mg  every 6 hours as needed  Calamine Lotion  1% cortisone cream   Yeast infection:  Gyne-lotrimin 7  Monistat 7    **If taking multiple medications, please check labels to avoid  duplicating the same active ingredients  **take medication as directed on the label  ** Do not exceed 4000 mg of tylenol in 24 hours  **Do not take medications that contain aspirin or ibuprofen          Childbirth Education Options: [facility] Classes:  Childbirth education classes can help you get ready for a positive parenting experience. You can also meet other expectant parents and get free stuff for your baby. Each class runs for five weeks on the same night and costs $45 for the mother-to-be and her support person. Medicaid covers the cost if you are eligible. Call [phone] to register. [facility] Childbirth Education: Classes can vary in availability and schedule is subject to change. For most up-to-date information please visit [email or web address] to review and register.      AREA PEDIATRIC/FAMILY PRACTICE PHYSICIANS  Central/[place]outheast [place] ([phone]) [facility] Deirdre Priesthambliss, MD; Lum BabeEniola, MD; Sheffield SliderHale, MD; Leveda AnnaHensel, MD; McDiarmid, MD; Jerene BearsMcIntyer, MD; Jennette KettleNeal, MD; Gwendolyn GrantWalden, MD [address] [place], [place] [address] [phone] Mon-Fri 8:30-12:30, 1:30-5:00 Providers come to see babies at [facility] Accepting [facility] providers who accept newborns: Docia ChuckKoirala, MD; Kateri PlummerMorrow, MD; Paulino RilyWolters, MD [address], [place], [place] [address] [phone] Mon-Fri 8:00-5:30 Babies seen by providers at [facility] Does NOT accept Medicaid Please  call early in hospitalization for appointment (limited availability)  [facility] [place], MD [address], [place], [place] [address] [phone] Farris Has, Thur, Fri 8:30-5:00, Wed 10:00-7:00 (closed 1-2pm) Babies seen by [facility] providers Accepting Medicaid Donnie Coffin - Pediatrician Donnie Coffin, MD [address], [place], [place] [address] [phone] Mon-Fri 8:30-5:00, Sat  8:30-12:00 Provider comes to see babies at [facility] Accepting Medicaid Must have been referred from current patients or contacted office prior to delivery [facility]) Manson Passey, MD; Ave Filter, MD; Luna Fuse, MD; Kennedy Bucker, MD; Konrad Dolores, MD; Kathlene November, MD; Jenne Campus, MD; Lubertha South, MD; Wynetta Emery, MD; Duffy Rhody, MD; Gerre Couch, NP; Shirl Harris, NP [address] [place], [place], [place] [address] [phone] Mon, Halford Decamp, Thur, Fri 8:30-5:30, Wed 9:30-5:30, Sat 8:30-12:30 Babies seen by [facility] providers Accepting Medicaid Only accepting infants of first-time parents or siblings of current patients Hospital discharge coordinator will make follow-up appointment Cyril Mourning [address] [place], [place]  [address] [phone]   Fax - [phone] [facility] [address], [place], [place]  [address] Phone - [phone]   Fax - [phone] Lucio Edward [address] [place], [place]  [address] [phone]  [place] [phone]) [facility] Jenne Pane, MD; Alita Chyle, MD; Princella Ion, MD; MD; Earlene Plater, MD; Jamesetta Orleans, MD; Alvera Novel, MD; Clarene Duke, MD; Rana Snare, MD; Carmon Ginsberg, MD; Alinda Money, MD; Hosie Poisson, MD; Mayford Knife, MD [address] [place], [place] [address] [phone] Mon-Fri 8:30-5:00 (extended evenings Mon-Thur as needed), Sat-Sun 10:00-1:00 Providers come to see babies at [facility] Accepting Medicaid for families of first-time babies and families with all children in the household age 53 and under. Must register with office prior to making appointment (M-F only). [facility] Suezanne Jacquet, NP; Lynelle Doctor, MD; Susann Givens, MD; [place], [place] [address] [place], [place] [address] [phone] Mon-Fri 8:00-5:00 Babies seen by providers at [facility] Does NOT accept Medicaid/Commercial Insurance Only [facility] [place] [facility]  Holly Bodily, MD; Zachery Dauer, MD; Stefan Church, MD; Sabino Dick, MD; Quitman Livings, MD; Farris Has, MD; Gaynell Face, MD; Betha Loa, MD; Colon Flattery, MD; Clifton James, MD [address] [place] [place], [place] [address] [phone] Mon-Fri 8:30-5:30, Sat (Oct.-Mar.) 9:00-1:00 Babies seen by providers at [facility] Accepting [facility]  [place] [phone]) [facility] Nestle, MD; Sheliah Hatch, MD [address], [place], [place] [address] [phone] Mon-Fri 8:30-5:00, Sat 8:30-12:00 Providers come to see babies at [facility] Does NOT accept [facility] at Lutricia Feil, [place]; Tracie Harrier, MD; [place], [place]; Wynelle Link, MD; Azucena Cecil, MD [address] [place], [place] [address] [phone] Mon-Fri 8:00-5:00 Babies seen by providers at [facility] Does NOT accept Medicaid Only accepting babies of parents who are patients Please call early in hospitalization for appointment (limited availability) [facility] Pediatricians Chestine Spore, MD; Abran Cantor, MD; Early Osmond, MD; Cherre Huger, NP; Hyacinth Meeker, MD; Dwan Bolt, MD; Jarold Motto, NP; Dario Guardian, MD; Talmage Nap, MD; Maisie Fus, MD; Pricilla Holm, MD; Tama High, MD [address] [place], [place], [place] [address] [phone] Mon-Fri 8:00-5:00, Sat 9:00-12:00 Providers come to see babies at [facility] Does NOT accept [facility] [phone]) Deboraha Sprang [facility] at [facility] providers accepting new patients: Drema Pry, NP; Delena Serve, PA [address] [place], [place] [address] [phone] Mon-Fri 8:00-5:00 Babies seen by providers at [facility] Does NOT accept Medicaid Only accepting babies of parents who are patients Please call early in hospitalization for appointment (limited availability) [facility] Cardell Peach, MD; Nash Dimmer, MD [phone] Shelva Majestic  [address], [place], [place] [address] [phone] (press 1 to schedule appointment) Mon-Fri 8:00-5:00 Providers come to see babies at [facility] Does NOT accept  [facility], MD [address], [place], [place] [address] [phone] Mon-Fri 8:30-5:00 (lunch 12:30-1:00), extended hours by appointment only Wed 5:00-6:30 Babies seen by [facility] providers Accepting Medicaid [facility] Carmine, MD; [place], MD; Hassan Rowan, MD [address] [place], [place], [place] [address] [phone] Mon-Fri 8:00-5:00 Babies seen by [facility] providers Does NOT accept Medicaid [facility] Boykin Peek, MD; Durene Cal, MD; [place], [place] [address] [place], [place] [address] [phone] Mon-Fri 8:00-5:00 Babies seen by [facility] providers Does NOT accept [facility] [place], [place]; [place], [place]; [place], NP; Avis Epley, MD; Vonna Kotyk, MD; Clance Boll, MD; Stevphen [place], NP; Arvilla Market, NP; Ann Maki, NP; Otis Dials, NP; Vaughan Basta, MD; Eartha Inch, MD [address] [place], [place] [address] [phone] Mon-Fri 8:30-5:00, Sat 10:00-1:00 Providers come to see babies at [facility] Does NOT accept Medicaid Free prenatal information session Tuesdays at 4:45pm [facility] [place], MD; [place], [place]; [place], [place]; [place], [place] [address] [place] [address] [phone] Mon-Fri 7:30-5:30 Babies seen by [facility] providers [facility] Doctor [address], [place], [place]  [address] [phone]   Fax - [phone]  [place] [phone] & [phone]) [facility], MD [address] [place], [place] [address] [phone] Mon-Thur 8:00-6:00 Providers come to see babies at [facility] Accepting Medicaid [facility] Dareen Piano, NP; Cyndia Bent, MD; [place], [place]; [place], [place] [address] [place], [place] [address] [phone] Mon-Thur 7:30-7:30, Fri 7:30-4:30 Babies seen by [facility] providers Accepting [facility] Juanito Doom, MD; Janene Harvey, NP; Vonita Moss,  MD [address], [place], [place] [address] [phone] Mon-Fri 8:30-5:00, Sat 8:30-12:00 Providers come to see babies at [facility] Accepting Medicaid Must have "Meet & Greet" appointment at office prior to delivery [facility] Marlow Baars, MD; Earlene Plater, MD; Lucretia Roers, MD [address] [address], [place], [place] [address] [phone] Mon-Wed 8:00-6:00, Thur-Fri 8:00-5:00, Sat 9:00-12:00 Providers come to see babies at [facility] Does NOT accept Medicaid Only accepting siblings of current patients [facility]  [address], [place], [place]  [address] [phone]   Fax - [phone] [facility] at [facility] [address], [place], [place]  [address] [phone]   Fax - [phone]  [place] [phone] & [phone]) Adult nurse [facility], [place]; [place], [place] [address] [place], [place] [address] [phone] Mon-Fri 7:00-5:00 Babies seen by [facility] providers Does NOT accept Medicaid [facility] Know[place], MD; [place], [place]; [place], [place] [address], [place], [place] [address] [phone] Mon-Fri 8:00-5:00 Babies seen by [facility] providers Accepting [facility] - Dorann Lodge [place], MD; [place], [place]; [place], NP; [place], [place] [address] [place], [place], [place] [address] [phone] Mon-Fri 8:00-5:00 Babies seen by providers at [facility] Accepting [facility] [place] [phone]) [facility] at [facility] [place], [place] [address] [place], [place] [address] [phone] Mon-Fri 8:00-5:00 Babies seen by [facility] providers Does NOT accept Medicaid Limited availability, please call early in hospitalization to schedule follow-up [facility] Jeanelle Malling, [place]; Eddie Candle, MD; Normand Sloop, MD; [place], [place]; Constance Goltz, MD; [place], [place] [address] [facility] [address], [place], [place] [address] [phone] Mon-Fri 8:30-5:00, Sat 9:00-12:00 Babies seen  by providers at [facility] Accepting Medicaid Please register online then schedule online or call office [email or web address] [facility] Family [facility] at [facility]) Durene Cal, NP; Lucianne Muss, MD; [place], [place] [address], [place], [place] [phone] Mon-Fri 8:00-5:00 Babies seen by providers at [facility] Accepting [facility] ([facility]) Prien, MD; Reed Breech, NP; Shelva Majestic, MD [address], [place], [place] [address] [phone] Mon-Fri 8:00-5:30, Sat&Sun by appointment (phones open at 8:30) Babies seen by [facility] providers Accepting Medicaid Must be a first-time baby or sibling of current patient [facility]s - [place]  [address], [place], [place]  [address] [phone]   Fax - [phone]  [place] [address] [phone] & [phone]) [facility] [place], [place]; [place], [place]; Kevin, MD; [place], [place]; Carolyne Fiscal, MD [address], [place], [place] [address] [phone] Mon-Thur 8:00-7:00, Fri 8:00-5:00, Sat 8:00-12:00, Sun 9:00-12:00 Babies seen by [facility] providers Accepting Medicaid [facility] - Family Medicine at Liana Gerold, MD; Gaynell Face, MD; [facility], MD [address], [place], [place] [address] [phone] Mon-Thur 8:00-5:00 Babies seen by providers at [facility] Accepting Medicaid [facility] - Family Medicine at Dorthey Sawyer, MD; Coe-Goins, MD; Madilyn Fireman, MD; Melvyn Neth, MD; List, MD; Lazarus Salines, MD; Gaynell Face, MD; Berneda Rose, MD; Flora Lipps, MD; Beryl Meager, MD; Luther Redo, MD; Lavonia Drafts, MD; Kellie Simmering, MD [address] [place], [place]  [address] [phone] Mon-Fri 8:00-5:30, Sat (Oct.-Mar.) 9:00-1:00 Babies seen by providers at [facility] Accepting Medicaid Must fill out new patient packet, available online at [email or web address] [facility] Pediatrics - [place] [facility] at [facility]) Spero Geralds, NP; Tiburcio Pea, NP; Tresa Endo, NP; Whitney Post, MD; [place], [place]; Hennie Duos, MD; [place], MD; Kavin Leech, NP [address], [place], [place] [address] [phone] Mon-Thur 8:00-5:30, Fri 8:00-5:00 Babies seen by providers at [facility] Accepting [facility]  [place] [phone]) [facility] [place], [place]; [place], MD; [place], MD; [place], [place] [address] [place], [place] [address] [phone] Mon-Fri 8:00-5:00 Babies seen by providers at [facility] Accepting [facility]   [place] [phone]) [place] [facility] at [facility], [place]; Lenise Arena, MD; [place], [place] [address], [place], [place] [phone] Mon-Fri 8:00-5:00 Babies seen by providers at [facility] Does NOT accept Medicaid Limited appointment availability, please call early in hospitalization  [place] [facility], [place]; [place], MD [address] [place], [place] [address] [phone] Mon-Fri 8:00-5:00 Babies seen by [facility] providers Does NOT accept [facility] - [facility], MD; Ninetta Lights, MD; [place], [place]; [place], MD [address], [place], [place] [address] [phone] Mon-Fri 8:00-5:00 After hours clinic [facility] [place], [place] [address]) [phone] Mon-Fri 5:00-8:00, Sat 12:00-6:00, Sun 10:00-4:00 Babies seen by [facility] providers Accepting [facility] Family Medicine at [facility] [address] [place], [place]  [address] [phone]   Fax - [phone]  [place] [phone]) Adult nurse Health[facility] at [facility], MD [address] [place], [place], [place]  [address] [phone] Mon-Fri 8:00-5:00 Babies seen by [facility] providers Does NOT accept Medicaid [facility] - [place] [facility] at [place]) Rene Kocher, MD [address] [place], [place] [address] [phone] Mon-Thur 8:00-7:00, Fri 8:00-5:00, Sat 8:00-12:00 Babies seen by providers at [facility] Accepting Medicaid - but does not have vaccinations in office (must be  received elsewhere) Limited availability, please call early in hospitalization  [place] [phone]) [facility]  Wyvonne Lenz, MD [address] [place] [address] [phone]  Fax [phone]  [facility]  [facility]  Lyndel Safe, MD, [place], [place], [place], [place] [address] [place], [place] [address] [phone] [facility]  [address] Sherian Maroon [place], [place] [address] [phone] [address] [place], [place] [address] [phone] [facility] Office)  [facility] [address], [place], [place] [address] [phone] [facility] [address] [place], [place], [place] [address] [phone] [facility] [address], [place], [place] [address] [phone] [facility] [address], [place], [place] [address] [phone] [facility] [address] [place], [place] [address] [phone] [facility] [address] [place], [place] [address] [phone] [facility]  [address], [place], [place] [address] [phone] Dr. Belia Heman. [place] [address] [place], [place] [address] [phone] [facility] [address], [place], [place] [address] [phone] [facility] [address], [place], [place] [address] [phone]

## 2021-05-04 NOTE — Progress Notes (Signed)
   PRENATAL VISIT NOTE  Subjective:  Sherry Colon is a 25 y.o. G2P1001 at [redacted]w[redacted]d being seen today for her first prenatal visit for this pregnancy.  She is currently monitored for the following issues for this high-risk pregnancy and has COVID-19 virus detected; Encounter for supervision of normal pregnancy in first trimester; Asthma affecting pregnancy in second trimester; Seizures (HCC); and Fibromyalgia on their problem list.  Patient reports headache and increased vaginal discharge.  Contractions: Not present. Vag. Bleeding: None.   . Denies leaking of fluid.   She is planning to both breast and bottle feed. Desires Depo Provera for contraception.   The following portions of the patient's history were reviewed and updated as appropriate: allergies, current medications, past family history, past medical history, past social history, past surgical history and problem list.   Objective:   Vitals:   05/04/21 0922  BP: 104/69  Pulse: 72  Weight: 129 lb 6.4 oz (58.7 kg)    Fetal Status: Fetal Heart Rate (bpm): 155         General:  Alert, oriented and cooperative. Patient is in no acute distress.  Skin: Skin is warm and dry. No rash noted.   Cardiovascular: Normal heart rate and rhythm noted  Respiratory: Normal respiratory effort, no problems with respiration noted. Clear to auscultation.   Abdomen: Soft, gravid, appropriate for gestational age. Normal bowel sounds. Non-tender. Pain/Pressure: Absent     Pelvic: Cervical exam performed Dilation: Closed Effacement (%): Thick   Normal cervical contour, no lesions, scant bleeding following pap, moderate white discharge  Extremities: Normal range of motion.  Edema: Trace  Mental Status: Normal mood and affect. Normal behavior. Normal judgment and thought content.   Assessment and Plan:  Pregnancy: G2P1001 at [redacted]w[redacted]d 1. Encounter for supervision of other normal pregnancy in first trimester - CBC/D/Plt+RPR+Rh+ABO+RubIgG... - Culture, OB  Urine - Genetic Screening - Cytology - PAP - AFP, Serum, Open Spina Bifida - CBE and Peds info given, other child is not under peds care in this area  2. Encounter for fetal anatomic survey - [place] MFM OB COMP + 14 WK; Future  3. [redacted] weeks gestation of pregnancy  4. Asthma affecting pregnancy in second trimester - albuterol (VENTOLIN HFA) 108 (90 Base) MCG/ACT inhaler; Inhale 2 puffs into the lungs every 6 (six) hours as needed for wheezing or shortness of breath.  Dispense: 1 each; Refill: 3  5. Seizures (HCC) - Cause unknown, no current treatment   6. Fibromyalgia - Discontinued treatment with +UPT - Has occasional headaches  Preterm labor/second trimester warning symptoms and general obstetric precautions including but not limited to vaginal bleeding, contractions, leaking of fluid and fetal movement were reviewed in detail with the patient. Please refer to After Visit Summary for other counseling recommendations.   Discussed the normal visit cadence for prenatal care Discussed the nature of our practice with multiple providers including residents and students   Return in about 4 weeks (around 06/01/2021) for LOB, In-Person.  No future appointments.  Vonzella Nipple, PA-C

## 2021-05-06 LAB — AFP, SERUM, OPEN SPINA BIFIDA
AFP MoM: 0.9
AFP Value: 33.5 ng/mL
Gest. Age on Collection Date: 15 weeks
Maternal Age At EDD: 24.1 yr
OSBR Risk 1 IN: 10000
Test Results:: NEGATIVE
Weight: 129 [lb_av]

## 2021-05-06 LAB — CBC/D/PLT+RPR+RH+ABO+RUBIGG...
Basophils Absolute: 0 10*3/uL (ref 0.0–0.2)
Basos: 0 %
EOS (ABSOLUTE): 0.1 10*3/uL (ref 0.0–0.4)
Eos: 1 %
HCV Ab: <0.1 s/co ratio (ref 0.0–0.9)
HIV Screen 4th Generation wRfx: NONREACTIVE
Hematocrit: 39 % (ref 34.0–46.6)
Hemoglobin: 13.6 g/dL (ref 11.1–15.9)
Hepatitis B Surface Ag: NEGATIVE
Immature Grans (Abs): 0.1 10*3/uL (ref 0.0–0.1)
Immature Granulocytes: 1 %
Lymphocytes Absolute: 1.8 10*3/uL (ref 0.7–3.1)
Lymphs: 28 %
MCH: 31.9 pg (ref 26.6–33.0)
MCHC: 34.9 g/dL (ref 31.5–35.7)
MCV: 91 fL (ref 79–97)
Monocytes Absolute: 0.4 10*3/uL (ref 0.1–0.9)
Monocytes: 7 %
Neutrophils Absolute: 4 10*3/uL (ref 1.4–7.0)
Neutrophils: 63 %
Platelets: 224 10*3/uL (ref 150–450)
RBC: 4.27 x10E6/uL (ref 3.77–5.28)
RDW: 12 % (ref 11.7–15.4)
RPR Ser Ql: NONREACTIVE
Rh Factor: POSITIVE
Rubella Antibodies, IGG: 2.23 index (ref >0.99)
WBC: 6.4 10*3/uL (ref 3.4–10.8)

## 2021-05-06 LAB — AB SCR+ANTIBODY ID: Antibody Screen: POSITIVE — AB

## 2021-05-06 LAB — URINE CULTURE, OB REFLEX

## 2021-05-06 LAB — CULTURE, OB URINE

## 2021-05-06 LAB — HCV INTERPRETATION

## 2021-05-10 ENCOUNTER — Encounter: Payer: Self-pay | Admitting: Medical

## 2021-05-10 DIAGNOSIS — Z3481 Encounter for supervision of other normal pregnancy, first trimester: Secondary | ICD-10-CM

## 2021-05-10 DIAGNOSIS — O289 Unspecified abnormal findings on antenatal screening of mother: Secondary | ICD-10-CM

## 2021-05-11 LAB — CYTOLOGY - PAP
Chlamydia: POSITIVE — AB
Comment: NEGATIVE
Comment: NORMAL
Diagnosis: NEGATIVE
Diagnosis: REACTIVE
Neisseria Gonorrhea: NEGATIVE

## 2021-05-12 ENCOUNTER — Encounter: Payer: Self-pay | Admitting: *Deleted

## 2021-05-12 DIAGNOSIS — O289 Unspecified abnormal findings on antenatal screening of mother: Secondary | ICD-10-CM | POA: Insufficient documentation

## 2021-05-12 DIAGNOSIS — A749 Chlamydial infection, unspecified: Secondary | ICD-10-CM | POA: Insufficient documentation

## 2021-05-12 MED ORDER — AZITHROMYCIN 250 MG PO TABS
1000.0000 mg | ORAL_TABLET | Freq: Once | ORAL | 0 refills | Status: AC
Start: 2021-05-12 — End: 2021-05-12

## 2021-05-12 NOTE — Progress Notes (Signed)
TC to patient to inform of chlamydia and of RX sent to pharmacy. Advised patient that both she and partner must be treated and abstain from sex until 2 weeks after treatment. Educational information sent via MyChart. STI report sent to [facility].

## 2021-05-12 NOTE — Addendum Note (Signed)
Addended by: Marny Lowenstein on: 05/12/2021 09:52 AM   Modules accepted: Orders

## 2021-05-20 ENCOUNTER — Encounter: Payer: Self-pay | Admitting: Medical

## 2021-05-31 ENCOUNTER — Ambulatory Visit: Payer: Medicare Other | Attending: Medical

## 2021-05-31 ENCOUNTER — Other Ambulatory Visit: Payer: Self-pay

## 2021-05-31 DIAGNOSIS — Z3689 Encounter for other specified antenatal screening: Secondary | ICD-10-CM | POA: Insufficient documentation

## 2021-06-01 ENCOUNTER — Encounter: Payer: Medicare Other | Admitting: Obstetrics & Gynecology

## 2021-06-01 ENCOUNTER — Telehealth: Payer: Self-pay | Admitting: *Deleted

## 2021-06-01 DIAGNOSIS — B3731 Acute candidiasis of vulva and vagina: Secondary | ICD-10-CM

## 2021-06-01 MED ORDER — FLUCONAZOLE 150 MG PO TABS: 150.0000 mg | ORAL_TABLET | Freq: Once | ORAL | 0 refills | Status: AC

## 2021-06-01 NOTE — Telephone Encounter (Signed)
TC to patient to check in after missed appointment today. Offered to switch appt to MyChart/ e visit. Declined. Reports signs/ symptoms yeast infection. RX for Diflucan sent per protocol. Patient advised that she needs to reschedule ASAP for TOC chlamydia. Call transferred to front office for patient to reschedule appt.

## 2021-06-09 ENCOUNTER — Encounter: Payer: Self-pay | Admitting: Obstetrics and Gynecology

## 2021-06-09 ENCOUNTER — Telehealth (INDEPENDENT_AMBULATORY_CARE_PROVIDER_SITE_OTHER): Payer: Medicare Other | Admitting: Obstetrics and Gynecology

## 2021-06-09 ENCOUNTER — Encounter: Payer: Medicare Other | Admitting: Obstetrics and Gynecology

## 2021-06-09 DIAGNOSIS — R569 Unspecified convulsions: Secondary | ICD-10-CM

## 2021-06-09 DIAGNOSIS — O280 Abnormal hematological finding on antenatal screening of mother: Secondary | ICD-10-CM

## 2021-06-09 DIAGNOSIS — O99512 Diseases of the respiratory system complicating pregnancy, second trimester: Secondary | ICD-10-CM

## 2021-06-09 DIAGNOSIS — O99891 Other specified diseases and conditions complicating pregnancy: Secondary | ICD-10-CM

## 2021-06-09 DIAGNOSIS — A568 Sexually transmitted chlamydial infection of other sites: Secondary | ICD-10-CM

## 2021-06-09 DIAGNOSIS — Z3A20 20 weeks gestation of pregnancy: Secondary | ICD-10-CM

## 2021-06-09 DIAGNOSIS — A749 Chlamydial infection, unspecified: Secondary | ICD-10-CM

## 2021-06-09 DIAGNOSIS — O289 Unspecified abnormal findings on antenatal screening of mother: Secondary | ICD-10-CM

## 2021-06-09 DIAGNOSIS — Z3481 Encounter for supervision of other normal pregnancy, first trimester: Secondary | ICD-10-CM

## 2021-06-09 DIAGNOSIS — J45909 Unspecified asthma, uncomplicated: Secondary | ICD-10-CM

## 2021-06-09 DIAGNOSIS — M797 Fibromyalgia: Secondary | ICD-10-CM

## 2021-06-09 DIAGNOSIS — O98812 Other maternal infectious and parasitic diseases complicating pregnancy, second trimester: Secondary | ICD-10-CM

## 2021-06-09 MED ORDER — PREPLUS 27-1 MG PO TABS: 1.0000 | ORAL_TABLET | Freq: Every day | ORAL | 13 refills | Status: DC

## 2021-06-09 MED ORDER — CITRANATAL BLOOM 90-1 MG PO TABS: 1.0000 | ORAL_TABLET | Freq: Every day | ORAL | 12 refills | Status: DC

## 2021-06-09 NOTE — Addendum Note (Signed)
Addended by: Harrel Lemon on: 06/09/2021 04:14 PM   Modules accepted: Orders

## 2021-06-09 NOTE — Progress Notes (Signed)
S/w pt for virtual visit. Pt reports fetal movement, denies pain. Pt does not have BP cuff with her today.

## 2021-06-09 NOTE — Progress Notes (Signed)
    TELEHEALTH OBSTETRICS VISIT ENCOUNTER NOTE  Provider location: Center for [facility] at [facility]   Patient location: Home  I connected with Sherry Colon on 06/09/21 at  3:30 PM EDT by telephone at home and verified that I am speaking with the correct person using two identifiers. Of note, unable to do video encounter due to technical difficulties.    I discussed the limitations, risks, security and privacy concerns of performing an evaluation and management service by telephone and the availability of in person appointments. I also discussed with the patient that there may be a patient responsible charge related to this service. The patient expressed understanding and agreed to proceed.  Subjective:  Sherry Colon is a 25 y.o. G2P1001 at [redacted]w[redacted]d being followed for ongoing prenatal care.  She is currently monitored for the following issues for this low-risk pregnancy and has COVID-19 virus detected; Encounter for supervision of normal pregnancy in first trimester; Asthma affecting pregnancy in second trimester; Seizures (HCC); Fibromyalgia; Chlamydia infection affecting pregnancy in second trimester, antepartum; and Abnormal finding on antenatal screen on their problem list.  Patient reports no complaints. Reports fetal movement. Denies any contractions, bleeding or leaking of fluid.   The following portions of the patient's history were reviewed and updated as appropriate: allergies, current medications, past family history, past medical history, past social history, past surgical history and problem list.   Objective:  Last menstrual period 12/12/2020, currently breastfeeding. General:  Alert, oriented and cooperative.   Mental Status: Normal mood and affect perceived. Normal judgment and thought content.  Rest of physical exam deferred due to type of encounter  Assessment and Plan:  Pregnancy: G2P1001 at [redacted]w[redacted]d 1. Chlamydia infection affecting pregnancy in second trimester,  antepartum Needs TOC with next visit  2. Asthma affecting pregnancy in second trimester Stable MDI PRN  3. Seizures (HCC) Stable No meds  4. Fibromyalgia Stable No pain reported today  5. Encounter for supervision of other normal pregnancy in first trimester Stable   6. Abnormal finding on antenatal screen Anti N on Antibody screen  Preterm labor symptoms and general obstetric precautions including but not limited to vaginal bleeding, contractions, leaking of fluid and fetal movement were reviewed in detail with the patient.  I discussed the assessment and treatment plan with the patient. The patient was provided an opportunity to ask questions and all were answered. The patient agreed with the plan and demonstrated an understanding of the instructions. The patient was advised to call back or seek an in-person office evaluation/go to MAU at [facility] for any urgent or concerning symptoms. Please refer to After Visit Summary for other counseling recommendations.   I provided 8 minutes of non-face-to-face time during this encounter.  Return in about 4 weeks (around 07/07/2021) for OB visit, face to face, any provider.  No future appointments.  Hermina Staggers, MD Center for [facility], [facility]

## 2021-07-07 ENCOUNTER — Ambulatory Visit (INDEPENDENT_AMBULATORY_CARE_PROVIDER_SITE_OTHER): Payer: Medicare Other

## 2021-07-07 ENCOUNTER — Other Ambulatory Visit: Payer: Self-pay

## 2021-07-07 ENCOUNTER — Other Ambulatory Visit (HOSPITAL_COMMUNITY)
Admission: RE | Admit: 2021-07-07 | Discharge: 2021-07-07 | Disposition: A | Discharge status: Home / Self Care | Payer: Medicare Other | Source: Ambulatory Visit

## 2021-07-07 VITALS — BP 124/70 | HR 98 | Wt 146.0 lb

## 2021-07-07 DIAGNOSIS — Z8619 Personal history of other infectious and parasitic diseases: Secondary | ICD-10-CM | POA: Diagnosis present

## 2021-07-07 DIAGNOSIS — N898 Other specified noninflammatory disorders of vagina: Secondary | ICD-10-CM

## 2021-07-07 DIAGNOSIS — Z23 Encounter for immunization: Secondary | ICD-10-CM | POA: Diagnosis not present

## 2021-07-07 DIAGNOSIS — Z3A24 24 weeks gestation of pregnancy: Secondary | ICD-10-CM

## 2021-07-07 DIAGNOSIS — Z3480 Encounter for supervision of other normal pregnancy, unspecified trimester: Secondary | ICD-10-CM

## 2021-07-07 NOTE — Progress Notes (Signed)
Pt presents for ROB and TOC CT. No longer involved with partner. She is unsure if partner was treated. Flu vaccine given RD without difficulty.

## 2021-07-07 NOTE — Progress Notes (Signed)
LOW-RISK PREGNANCY OFFICE VISIT  Patient name: Sherry Colon MRN [id number]  Date of birth: 08-11-1996 Chief Complaint:   Routine Prenatal Visit  Subjective:   Sherry Colon is a 25 y.o. G19P1001 female at [redacted]w[redacted]d with an Estimated Date of Delivery: 10/24/21 being seen today for ongoing management of a Low-risk pregnancy aeb has COVID-19 virus detected; Supervision of other normal pregnancy, antepartum; Asthma affecting pregnancy in second trimester; Seizures (HCC); Fibromyalgia; Chlamydia infection affecting pregnancy in second trimester, antepartum; and Abnormal finding on antenatal screen on their problem list.  Patient presents today with backache.  She states the pain is worse with sitting and improves with walking.  Patient reports she is not taking her fibromyalgia medication and feels this is contributing to her aches and pains. Patient endorses fetal movement. Patient denies abdominal cramping or contractions.  Patient denies vaginal concerns including abnormal discharge, leaking of fluid, and bleeding. However, she has noted an increase in discharge and questions if it is normal. Contractions: Not present. Vag. Bleeding: None.  Movement: Present.  Reviewed past medical,surgical, social, obstetrical and family history as well as problem list, medications and allergies.  Objective   Vitals:   07/07/21 1424  BP: 124/70  Pulse: 98  Weight: 146 lb (66.2 kg)  Body mass index is 26.7 kg/m.  Total Weight Gain:27 lb (12.2 kg)         Physical Examination:   General appearance: Well appearing, and in no distress  Mental status: Alert, oriented to person, place, and time  Skin: Warm & dry  Cardiovascular: Normal heart rate noted  Respiratory: Normal respiratory effort, no distress  Abdomen: Soft, gravid, nontender, AGA with Fundal Height: 24 cm  Pelvic: Cervical exam deferred           Extremities: Edema: None  Fetal Status: Fetal Heart Rate (bpm): 156  Movement: Present   No  results found for this or any previous visit (from the past 24 hour(s)).  Assessment & Plan:  Low-risk pregnancy of a 25 y.o., G2P1001 at [redacted]w[redacted]d with an Estimated Date of Delivery: 10/24/21   1. Supervision of other normal pregnancy, antepartum -Anticipatory guidance for upcoming appts. -Patient to schedule next appt in 4 weeks for an in-person visit. -Reviewed Glucola appt preparation including fasting the night before and morning of.   *Informed that it is okay to drink plain water throughout the night and prior to consumption of Glucola formula.  -Discussed anticipated office time of 2.5-3 hours.  -Reviewed blood draw procedures and labs which also include check of iron/HgB level, RPR, and HIV *Informed that repeat RPR/HIV are for pediatric records/compliance.  -Discussed how results of GTT are handled including diabetic education and BS testing for abnormal results and routine care for normal results.    2. [redacted] weeks gestation of pregnancy -Doing well. -Reviewed interventions to assist in reducing back pain. -Informed that provider would research safety of fibromyalgia medication usage during pregnancy. -Will plan to consult with MD and send mychart message of findings.   3. Flu vaccine need -Vaccine given  4. History of chlamydia infection -TOC ftoday  5. Vaginal discharge -Will perform CV -Reassured that vaginal discharge is normal in pregnancy. -Discussed limited usage of pantyliners and frequent changing when in use.     Meds: No orders of the defined types were placed in this encounter.  Labs/procedures today:  Lab Orders  No laboratory test(s) ordered today     Reviewed: Preterm labor symptoms and general obstetric precautions including but not limited  to vaginal bleeding, contractions, leaking of fluid and fetal movement were reviewed in detail with the patient.  All questions were answered.  Follow-up: Return in about 4 weeks (around 08/04/2021).  Orders Placed  This Encounter  Procedures   Flu Vaccine QUAD 36+ mos IM (Fluarix, Quad PF)   Cherre Robins MSN, CNM 07/07/2021

## 2021-07-11 LAB — CERVICOVAGINAL ANCILLARY ONLY
Bacterial Vaginitis (gardnerella): POSITIVE — AB
Candida Glabrata: NEGATIVE
Candida Vaginitis: NEGATIVE
Chlamydia: NEGATIVE
Comment: NEGATIVE
Comment: NEGATIVE
Comment: NEGATIVE
Comment: NEGATIVE
Comment: NEGATIVE
Comment: NORMAL
Neisseria Gonorrhea: NEGATIVE
Trichomonas: POSITIVE — AB

## 2021-07-12 ENCOUNTER — Other Ambulatory Visit: Payer: Self-pay

## 2021-07-12 DIAGNOSIS — A599 Trichomoniasis, unspecified: Secondary | ICD-10-CM

## 2021-07-12 MED ORDER — METRONIDAZOLE 500 MG PO TABS: 500.0000 mg | ORAL_TABLET | Freq: Two times a day (BID) | ORAL | 0 refills | Status: DC

## 2021-07-12 NOTE — Progress Notes (Signed)
Pt is positive for trich and BV on recent vaginal swab. Flagyl sent in per protocol to treat both.

## 2021-07-14 DIAGNOSIS — A5901 Trichomonal vulvovaginitis: Secondary | ICD-10-CM | POA: Insufficient documentation

## 2021-07-14 DIAGNOSIS — O23592 Infection of other part of genital tract in pregnancy, second trimester: Secondary | ICD-10-CM | POA: Insufficient documentation

## 2021-08-04 ENCOUNTER — Other Ambulatory Visit: Payer: Self-pay

## 2021-08-04 ENCOUNTER — Other Ambulatory Visit: Payer: Medicare Other

## 2021-08-04 ENCOUNTER — Ambulatory Visit (INDEPENDENT_AMBULATORY_CARE_PROVIDER_SITE_OTHER): Payer: Medicare Other

## 2021-08-04 VITALS — BP 110/61 | HR 78 | Wt 148.0 lb

## 2021-08-04 DIAGNOSIS — O23592 Infection of other part of genital tract in pregnancy, second trimester: Secondary | ICD-10-CM

## 2021-08-04 DIAGNOSIS — Z3480 Encounter for supervision of other normal pregnancy, unspecified trimester: Secondary | ICD-10-CM

## 2021-08-04 DIAGNOSIS — M797 Fibromyalgia: Secondary | ICD-10-CM

## 2021-08-04 DIAGNOSIS — A5901 Trichomonal vulvovaginitis: Secondary | ICD-10-CM

## 2021-08-04 DIAGNOSIS — A749 Chlamydial infection, unspecified: Secondary | ICD-10-CM

## 2021-08-04 DIAGNOSIS — Z114 Encounter for screening for human immunodeficiency virus [HIV]: Secondary | ICD-10-CM

## 2021-08-04 DIAGNOSIS — O98812 Other maternal infectious and parasitic diseases complicating pregnancy, second trimester: Secondary | ICD-10-CM

## 2021-08-04 DIAGNOSIS — Z3A28 28 weeks gestation of pregnancy: Secondary | ICD-10-CM

## 2021-08-04 NOTE — Patient Instructions (Signed)
AREA FAMILY PRACTICE PHYSICIANS  [place] ([address]) [facility] [address] [place], [place] [address] [phone] Mon-Fri 8:30-12:30, 1:30-5:00 Accepting Medicaid [facility] [address], [place], [place] [address] [phone] Mon-Fri 8:00-5:30 [facility] [address] [place], [place] [address] [phone] Mon, Tue, Thur, Fri 8:30-5:00, Wed 10:00-7:00 (closed 1-2pm) Accepting Medicaid [facility] [address], [place], [place]  [address] Phone - [phone]   Fax - [phone]  [place] ([address]) [facility] [address] [place], [place] [address] [phone] Mon-Fri 8:00-5:00 [facility] ([facility])  [address] [place], [place] [address] [phone] Mon-Fri 8:30-5:30, Sat (Oct.-Mar.) 9:00-1:00 Accepting Medicaid  [place] ([address]) [facility] [address], [place], [place] [address] [phone] Mon-Fri 8:00-5:00  [place] ([address]) [facility] [address], [place], [place] [address] [phone] Mon-Fri 8:00-5:00 [facility] [address], [place], [place] [address] [phone] Mon-Fri 8:00-5:00 [facility] [address] [place], [place] [address] [phone] Mon-Fri 8:00-5:00 [facility] [address] [place] [address] [phone] Mon-Fri 7:30-5:30  [place] ([address] & [address]) [facility] [address] [place], [place] [address] [phone] Mon-Thur 8:00-6:00 Accepting Medicaid [facility] [address] [place], [place] [address] [phone] Mon-Thur 7:30-7:30, Fri 7:30-4:30 Accepting  Medicaid [facility] [address], [place], [place]  [address] [phone]   Fax - [phone]  [place] ([address] & [address]) [facility] [address] [place], [place] [address] [phone] Mon-Fri 7:00-5:00 [facility] [address], [place], [place] [address] [phone] Mon-Fri 8:00-5:00 Accepting Medicaid [facility] [address], , [place] [address] [phone] Mon-Fri 8:00-5:00 Accepting Medicaid  [place] ([address]) [facility] [place] [address] [place], [place] [address] [phone] Mon-Fri 8:00-5:00 [facility] [address], [place], [place] [address] [phone] Mon-Fri 8:00-5:00 Accepting Medicaid [facility] ([facility]) [address], [place], [place] [address] [phone] Mon-Fri 8:00-5:30, Sat&Sun by appointment (phones open at 8:30) Accepting Medicaid  [place] ([address]) [facility] [address] [place], [place] [address] [phone] Mon-Thur 8:00-7:00, Fri 8:00-5:00, Sat 8:00-12:00, Sun 9:00-12:00 Accepting Medicaid [facility] [place] [address], [place], [place] [address] [phone] Mon-Thur 8:00-5:00 Accepting Medicaid [facility] [address] [place], [place] [address] [phone] Mon-Fri 8:00-5:30, Sat (Oct.-Mar.) 9:00-1:00 Accepting Medicaid  [place] ([address]) [facility] [address], [place], [place] [address] [phone] Mon-Fri 8:00-5:00 Accepting Medicaid   [place] ([address]) [facility] [place] [address], [place], [place] [address] [phone] Mon-Fri 8:00-5:00  [facility] [place] [address], [place], [place] [address] [phone] Mon-Fri 8:00-5:00 [facility] [place] [address] [place] [address], [place], [place] [address] [phone] Mon-Fri 8:00-5:00 After hours clinic ([address] [place],  [address]) [phone] Mon-Fri 5:00-8:00, Sat 12:00-6:00, Sun 10:00-4:00 Accepting Medicaid [facility] [place]   [address], [place], [place]  [address] [phone]   Fax - [phone]  [place] ([address]) [facility] [place] [address], [place], [place] [address] [phone] Mon-Fri 8:00-5:00 [facility] [place] ([facility] [place]) [address], [place], [place] [address] [phone] Mon-Thur 8:00-7:00, Fri 8:00-5:00, Sat 8:00-12:00    

## 2021-08-04 NOTE — Progress Notes (Signed)
LOW-RISK PREGNANCY OFFICE VISIT  Patient name: Sherry Colon MRN [id number]  Date of birth: [birth year] Chief Complaint:   Routine Prenatal Visit  Subjective:   Sherry Colon is a 25 y.o. G19P1001 female at [redacted]w[redacted]d with an Estimated Date of Delivery: 10/24/21 being seen today for ongoing management of a low-risk pregnancy aeb has COVID-19 virus detected; Supervision of other normal pregnancy, antepartum; Asthma affecting pregnancy in second trimester; Seizures (HCC); Fibromyalgia; Chlamydia infection affecting pregnancy in second trimester, antepartum; Abnormal finding on antenatal screen; and Trichomonal vaginitis during pregnancy in second trimester on their problem list.  Patient presents today with no complaints.  Patient endorses fetal movement. Patient denies abdominal cramping or contractions.  Patient denies vaginal concerns including abnormal discharge, leaking of fluid, and bleeding.  Patient states she completed prescription for Trich and partner was also treated.   Contractions: Not present. Vag. Bleeding: None.  Movement: Present.  Reviewed past medical,surgical, social, obstetrical and family history as well as problem list, medications and allergies.  Objective   Vitals:   08/04/21 0903  BP: 110/61  Pulse: 78  Weight: 148 lb (67.1 kg)  Body mass index is 27.07 kg/m.  Total Weight Gain:29 lb (13.2 kg)         Physical Examination:   General appearance: Well appearing, and in no distress  Mental status: Alert, oriented to person, place, and time  Skin: Warm & dry  Cardiovascular: Normal heart rate noted  Respiratory: Normal respiratory effort, no distress  Abdomen: Soft, gravid, nontender, AGA with Fundal Height: 27 cm  Pelvic: Cervical exam deferred           Extremities:    Fetal Status: Fetal Heart Rate (bpm): 145  Movement: Present   No results found for this or any previous visit (from the past 24 hour(s)).  Assessment & Plan:  Low-risk pregnancy of a 25  y.o., G2P1001 at [redacted]w[redacted]d with an Estimated Date of Delivery: 10/24/21   1. Supervision of other normal pregnancy, antepartum -Anticipatory guidance for upcoming appts. -Patient to schedule next appt in 2 weeks for an in-person visit. -Scheduled for childbirth class on Jan 11th.   2. Chlamydia infection affecting pregnancy in second trimester, antepartum -TOC was negative -Routine testing at 36 weeks or prn  3. Trichomonal vaginitis during pregnancy in second trimester -Treatment completed. -Plan for TOC at next visit.  4. Fibromyalgia -No complaints. -Reports was initially managed by PCP in [place]. No PCP in [place]. -Will give list for PCP.  5. [redacted] weeks gestation of pregnancy -Completed Glucola today -Reviewed blood draw procedures and labs which also include check of iron/HgB level, RPR, and HIV *Informed that repeat RPR/HIV are for pediatric records/compliance.  -Informed that Medicare may not fully pay for HIV test despite it being standard/routine testing.  -Discussed how results of GTT are handled including diabetic education and BS testing for abnormal results and routine care for normal results.     Meds: No orders of the defined types were placed in this encounter.  Labs/procedures today:  Lab Orders         Glucose Tolerance, 2 Hours w/1 Hour         CBC         HIV Antibody (routine testing w rflx)         RPR       Reviewed: Preterm labor symptoms and general obstetric precautions including but not limited to vaginal bleeding, contractions, leaking of fluid and fetal movement were reviewed in detail with  the patient.  All questions were answered.  Follow-up: Return in about 2 weeks (around 08/18/2021) for LROB with TOC.  Orders Placed This Encounter  Procedures   Glucose Tolerance, 2 Hours w/1 Hour   CBC   HIV Antibody (routine testing w rflx)   RPR   Cherre Robins MSN, CNM 08/04/2021

## 2021-08-05 LAB — CBC
Hematocrit: 34.9 % (ref 34.0–46.6)
Hemoglobin: 12 g/dL (ref 11.1–15.9)
MCH: 31.3 pg (ref 26.6–33.0)
MCHC: 34.4 g/dL (ref 31.5–35.7)
MCV: 91 fL (ref 79–97)
Platelets: 238 10*3/uL (ref 150–450)
RBC: 3.84 x10E6/uL (ref 3.77–5.28)
RDW: 11.6 % — ABNORMAL LOW (ref 11.7–15.4)
WBC: 5.6 10*3/uL (ref 3.4–10.8)

## 2021-08-05 LAB — GLUCOSE TOLERANCE, 2 HOURS W/ 1HR
Glucose, 1 hour: 139 mg/dL (ref 70–179)
Glucose, 2 hour: 97 mg/dL (ref 70–152)
Glucose, Fasting: 81 mg/dL (ref 70–91)

## 2021-08-05 LAB — RPR: RPR Ser Ql: NONREACTIVE

## 2021-08-05 LAB — HIV ANTIBODY (ROUTINE TESTING W REFLEX): HIV Screen 4th Generation wRfx: NONREACTIVE

## 2021-08-17 ENCOUNTER — Encounter: Payer: Self-pay | Admitting: Women's Health

## 2021-08-17 ENCOUNTER — Ambulatory Visit (INDEPENDENT_AMBULATORY_CARE_PROVIDER_SITE_OTHER): Payer: Medicare Other | Admitting: Women's Health

## 2021-08-17 ENCOUNTER — Other Ambulatory Visit: Payer: Self-pay

## 2021-08-17 VITALS — BP 120/70 | HR 70 | Wt 152.0 lb

## 2021-08-17 DIAGNOSIS — O289 Unspecified abnormal findings on antenatal screening of mother: Secondary | ICD-10-CM

## 2021-08-17 DIAGNOSIS — O99512 Diseases of the respiratory system complicating pregnancy, second trimester: Secondary | ICD-10-CM

## 2021-08-17 DIAGNOSIS — R569 Unspecified convulsions: Secondary | ICD-10-CM

## 2021-08-17 DIAGNOSIS — Z3480 Encounter for supervision of other normal pregnancy, unspecified trimester: Secondary | ICD-10-CM

## 2021-08-17 DIAGNOSIS — Z3A30 30 weeks gestation of pregnancy: Secondary | ICD-10-CM

## 2021-08-17 DIAGNOSIS — Z23 Encounter for immunization: Secondary | ICD-10-CM | POA: Diagnosis not present

## 2021-08-17 DIAGNOSIS — M797 Fibromyalgia: Secondary | ICD-10-CM

## 2021-08-17 DIAGNOSIS — A5901 Trichomonal vulvovaginitis: Secondary | ICD-10-CM

## 2021-08-17 DIAGNOSIS — J45909 Unspecified asthma, uncomplicated: Secondary | ICD-10-CM

## 2021-08-17 DIAGNOSIS — O98812 Other maternal infectious and parasitic diseases complicating pregnancy, second trimester: Secondary | ICD-10-CM

## 2021-08-17 DIAGNOSIS — A749 Chlamydial infection, unspecified: Secondary | ICD-10-CM

## 2021-08-17 DIAGNOSIS — O23592 Infection of other part of genital tract in pregnancy, second trimester: Secondary | ICD-10-CM

## 2021-08-17 NOTE — Patient Instructions (Signed)
Maternity Assessment Unit (MAU) ° °The Maternity Assessment Unit (MAU) is located at the [facility]. The address is: [address], [address], [place], [place] [address]. Please see map below for additional directions. ° ° ° °The Maternity Assessment Unit is designed to help you during your pregnancy, and for up to 6 weeks after delivery, with any pregnancy- or postpartum-related emergencies, if you think you are in labor, or if your water has broken. For example, if you experience nausea and vomiting, vaginal bleeding, severe abdominal or pelvic pain, elevated blood pressure or other problems related to your pregnancy or postpartum time, please come to the Maternity Assessment Unit for assistance. ° ° ° ° ° ° °AREA PEDIATRIC/FAMILY PRACTICE PHYSICIANS ° °[facility] °[address] °[address] °[place], [place] [address] °Phone - [phone]   Fax - [phone] ° °[facility] °[address] °[place], [place]  [address] °Phone - [phone]   Fax - [phone] ° °[facility] °[address] °[place], [place]  [address] °Phone - [phone]   Fax - [phone] ° °[facility] °[address] °[place], [place]  [address] °Phone - [phone]   Fax - [phone] ° °[facility] °[address] °[place], [place]  [address] °Phone - [phone]   Fax - [phone] ° °[facility] °[address] °[place], [place]  [address] °Phone - [phone]   Fax - [phone] ° °[facility] °[address] °[place], [place]  [address] °Phone - [phone]   Fax - [phone] ° °[facility] °[address] °[place], [place]  [address] °Phone - [phone]   Fax - [phone] ° °[facility] °[address] °[place], [place]  [address] °Phone - [phone]   Fax -  [phone] °[facility] °[address] °[place], [place]  [address] °Phone - [phone]   Fax - [phone] ° °[facility] °[address] °[place], [place]  [address] °Phone - [phone]   Fax - [phone] ° °[facility] °[address] °[place], [place]  [address] °Phone - [phone]   Fax - [phone] ° °[facility] °[address] °[place], [place]  [address] °Phone - [phone]   Fax - [phone] ° °[facility] °[address] °[place], [place]  [address] °Phone - [phone] ° °[facility] °[address] °[place], [place]  [address] °Phone - [phone]   Fax - [phone] ° °[facility] °[address] °[place], [place]  [address] °Phone - [phone]   Fax - [phone] ° °[facility] °[address] °[place], [place]  [address] °Phone - [phone]   Fax - [phone] ° °[facility] °[address] °[place], [place]  [address] °Phone - [phone]   Fax - [phone] ° ° °[facility] °[address] °[place], [place]  [address] °Phone - [phone]   Fax - [phone] ° °[facility] °[address] °[place], [place]  [address] °Phone - [phone]   Fax - [phone] ° °[facility] °[address] °, [place]  [address] °Phone - [phone]   Fax - [phone] ° °[facility] °[address] °,   [address] °Phone - [phone]     Fax - [phone] ° °[facility] °[address] °[place], [place]  [address] °Phone - [phone]   Fax - [phone] °[facility] °[address] °[place], [place]  [address] °Phone - [phone]   Fax - [phone] ° °[facility] °[address] °[place], [place]  [address] °Phone - [phone]   Fax - [phone] ° °  [facility] °[address] °[place],   [address] °Phone - [phone]   Fax - [phone] ° ° ° ° ° ° ° ° °

## 2021-08-17 NOTE — Progress Notes (Signed)
Subjective:  Sherry Colon is a 25 y.o. G2P1001 at [redacted]w[redacted]d being seen today for ongoing prenatal care.  She is currently monitored for the following issues for this high-risk pregnancy and has COVID-19 virus detected; Supervision of other normal pregnancy, antepartum; Asthma affecting pregnancy in second trimester; Seizures (HCC); Fibromyalgia; Chlamydia infection affecting pregnancy in second trimester, antepartum; Abnormal finding on antenatal screen; and Trichomonal vaginitis during pregnancy in second trimester on their problem list.  Patient reports no complaints.  Contractions: Not present. Vag. Bleeding: None.  Movement: Present. Denies leaking of fluid.   The following portions of the patient's history were reviewed and updated as appropriate: allergies, current medications, past family history, past medical history, past social history, past surgical history and problem list. Problem list updated.  Objective:   Vitals:   08/17/21 1559  BP: 120/70  Pulse: 70  Weight: 152 lb (68.9 kg)    Fetal Status: Fetal Heart Rate (bpm): 144   Movement: Present     General:  Alert, oriented and cooperative. Patient is in no acute distress.  Skin: Skin is warm and dry. No rash noted.   Cardiovascular: Normal heart rate noted  Respiratory: Normal respiratory effort, no problems with respiration noted  Abdomen: Soft, gravid, appropriate for gestational age. Pain/Pressure: Present     Pelvic: Vag. Bleeding: None     Cervical exam deferred        Extremities: Normal range of motion.     Mental Status: Normal mood and affect. Normal behavior. Normal judgment and thought content.   Urinalysis:      Assessment and Plan:  Pregnancy: G2P1001 at [redacted]w[redacted]d  1. Supervision of other normal pregnancy, antepartum -peds list given  PHQ9 SCORE ONLY 08/04/2021 03/23/2021  PHQ-9 Total Score 2 3   GAD 7 : Generalized Anxiety Score 03/23/2021  Nervous, Anxious, on Edge 0  Control/stop worrying 0  Worry too  much - different things 0  Trouble relaxing 0  Restless 0  Easily annoyed or irritable 2  Afraid - awful might happen 0  Total GAD 7 Score 2   2. Fibromyalgia -no concerns  3. Trichomonal vaginitis during pregnancy in second trimester -TOC next visit, less than 4 weeks since completing treatment today  4. Chlamydia infection affecting pregnancy in second trimester, antepartum -TOC 07/07/2021 negative  5. 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 Titer not indicated.   6. Asthma affecting pregnancy in second trimester -no concerns, has rescue inhaler at home -pt reports increased use of rescue inhaler with cold weather, but does not believe her asthma is worsening. Patient advised that once very cold weather is gone if she is still having increased use of inhaler to let us know.  7. Seizures (HCC) -Unknown cause, occurs with overheating, No meds  8. [redacted] weeks gestation of pregnancy  Preterm labor symptoms and general obstetric precautions including but not limited to vaginal bleeding, contractions, leaking of fluid and fetal movement were reviewed in detail with the patient. I discussed the assessment and treatment plan with the patient. The patient was provided an opportunity to ask questions and all were answered. The patient agreed with the plan and demonstrated an understanding of the instructions. The patient was advised to call back or seek an in-person office evaluation/go to MAU at [facility] for any urgent or concerning symptoms. Please refer to  After Visit Summary for other counseling recommendations.  Return in about 2 weeks (around 08/31/2021) for in-person LOB/APP OK.   Damaris Geers, Gerrie Nordmann, NP

## 2021-08-17 NOTE — Progress Notes (Signed)
Pt presents for ROB reports intermittent bilateral low abdominal pain, denies LOF, VB. Tdap given today R Del without difficulty.

## 2021-08-28 NOTE — L&D Delivery Note (Signed)
Delivery Note At [phone] a viable female infant was delivered via SVD, presentation: LOA. APGAR: 8, 9; weight pending.   Placenta status: spontaneously delivered intact with gentle cord traction. Fundus firm with massage and Pitocin.   Anesthesia: none Lacerations: superficial left labia minora hemostatic, 1st degree skin hemostatic Est. Blood Loss (mL): 300 Placenta to LD Complications none Cord ph n/a   Mom to postpartum. Baby to Couplet care / Skin to Skin.    Donette Larry, CNM 10/20/2021 9:43 AM

## 2021-09-02 ENCOUNTER — Other Ambulatory Visit (HOSPITAL_COMMUNITY)
Admission: RE | Admit: 2021-09-02 | Discharge: 2021-09-02 | Disposition: A | Discharge status: Home / Self Care | Payer: Medicare Other | Source: Ambulatory Visit

## 2021-09-02 ENCOUNTER — Other Ambulatory Visit: Payer: Self-pay

## 2021-09-02 ENCOUNTER — Ambulatory Visit (INDEPENDENT_AMBULATORY_CARE_PROVIDER_SITE_OTHER): Payer: Medicare Other

## 2021-09-02 VITALS — BP 107/69 | HR 96 | Wt 155.0 lb

## 2021-09-02 DIAGNOSIS — G479 Sleep disorder, unspecified: Secondary | ICD-10-CM

## 2021-09-02 DIAGNOSIS — Z3A32 32 weeks gestation of pregnancy: Secondary | ICD-10-CM | POA: Insufficient documentation

## 2021-09-02 DIAGNOSIS — Z3480 Encounter for supervision of other normal pregnancy, unspecified trimester: Secondary | ICD-10-CM | POA: Insufficient documentation

## 2021-09-02 DIAGNOSIS — O23592 Infection of other part of genital tract in pregnancy, second trimester: Secondary | ICD-10-CM

## 2021-09-02 DIAGNOSIS — A5901 Trichomonal vulvovaginitis: Secondary | ICD-10-CM | POA: Diagnosis present

## 2021-09-02 DIAGNOSIS — J302 Other seasonal allergic rhinitis: Secondary | ICD-10-CM

## 2021-09-02 DIAGNOSIS — R5383 Other fatigue: Secondary | ICD-10-CM

## 2021-09-02 MED ORDER — UNISOM SLEEPTABS 25 MG PO TABS: 25.0000 mg | ORAL_TABLET | Freq: Every day | ORAL | 2 refills | Status: DC

## 2021-09-02 MED ORDER — CETIRIZINE HCL 10 MG PO TABS: 10.0000 mg | ORAL_TABLET | Freq: Every day | ORAL | 2 refills | Status: DC

## 2021-09-02 NOTE — Progress Notes (Signed)
° °  LOW-RISK PREGNANCY OFFICE VISIT  Patient name: Sherry Colon MRN [id number]  Date of birth: 1995-12-13 Chief Complaint:   Routine Prenatal Visit  Subjective:   Sherry Colon is a 26 y.o. G55P1001 female at [redacted]w[redacted]d with an Estimated Date of Delivery: 10/24/21 being seen today for ongoing management of a low-risk pregnancy aeb has COVID-19 virus detected; Supervision of other normal pregnancy, antepartum; Asthma affecting pregnancy in second trimester; Seizures ([place]); Fibromyalgia; Chlamydia infection affecting pregnancy in second trimester, antepartum; Abnormal finding on antenatal screen; and Trichomonal vaginitis during pregnancy in second trimester on their problem list.  Patient presents today with no complaints.  Patient endorses fetal movement. Patient denies abdominal cramping or contractions.  Patient denies vaginal concerns including abnormal discharge, leaking of fluid, and bleeding.  Contractions: Irritability. Vag. Bleeding: None.  Movement: Present.  Reviewed past medical,surgical, social, obstetrical and family history as well as problem list, medications and allergies.  Objective   Vitals:   09/02/21 1121  Weight: 155 lb (70.3 kg)  Body mass index is 28.35 kg/m.  Total Weight Gain:36 lb (16.3 kg)         Physical Examination:   General appearance: Well appearing, and in no distress  Mental status: Alert, oriented to person, place, and time  Skin: Warm & dry  Cardiovascular: Normal heart rate noted  Respiratory: Normal respiratory effort, no distress  Abdomen: Soft, gravid, nontender, AGA with Fundal Height: 32 cm  Pelvic: Cervical exam deferred           Extremities: Edema: None  Fetal Status: Fetal Heart Rate (bpm): 147  Movement: Present   No results found for this or any previous visit (from the past 24 hour(s)).  Assessment & Plan:  Low-risk pregnancy of a 26 y.o., G2P1001 at [redacted]w[redacted]d with an Estimated Date of Delivery: 10/24/21   1. Supervision of other normal  pregnancy, antepartum -Anticipatory guidance for upcoming appts. -Patient to schedule next appt in 2 weeks for an virtual visit. -Educated on GBS bacteria including what it is, why we test, and how and when we treat if needed.  2. [redacted] weeks gestation of pregnancy -Doing well overall. -Reviewed complaints.   3. Fatigue due to sleep pattern disturbance -Reviewed sleep promotion techniques. -Discussed usage of sleeping aide.  Patient desires. -Rx for unisom sent to pharmacy on file.   4. Seasonal allergies -Discussed management with cetrizine. -Rx sent to pharmacy on file.   5. Trichomonal vaginitis during pregnancy in second trimester -TOC collected today.       Meds: No orders of the defined types were placed in this encounter.  Labs/procedures today:  Lab Orders  No laboratory test(s) ordered today     Reviewed: Preterm labor symptoms and general obstetric precautions including but not limited to vaginal bleeding, contractions, leaking of fluid and fetal movement were reviewed in detail with the patient.  All questions were answered.  Follow-up: Return in about 2 weeks (around 09/16/2021) for Virtual LR-ROB.  No orders of the defined types were placed in this encounter.  Sherry Conners MSN, CNM 09/02/2021

## 2021-09-02 NOTE — Progress Notes (Signed)
ROB 32.4 wks No unusual complaints. 

## 2021-09-03 ENCOUNTER — Encounter (HOSPITAL_COMMUNITY): Payer: Self-pay | Admitting: Emergency Medicine

## 2021-09-03 ENCOUNTER — Emergency Department (HOSPITAL_COMMUNITY)
Admission: EM | Admit: 2021-09-03 | Discharge: 2021-09-03 | Disposition: A | Discharge status: Home / Self Care | Payer: Medicare Other | Attending: Emergency Medicine | Admitting: Emergency Medicine

## 2021-09-03 ENCOUNTER — Other Ambulatory Visit: Payer: Self-pay

## 2021-09-03 DIAGNOSIS — H9201 Otalgia, right ear: Secondary | ICD-10-CM | POA: Diagnosis present

## 2021-09-03 DIAGNOSIS — H6690 Otitis media, unspecified, unspecified ear: Secondary | ICD-10-CM

## 2021-09-03 DIAGNOSIS — H6691 Otitis media, unspecified, right ear: Secondary | ICD-10-CM | POA: Insufficient documentation

## 2021-09-03 LAB — CERVICOVAGINAL ANCILLARY ONLY
Comment: NEGATIVE
Trichomonas: NEGATIVE

## 2021-09-03 MED ORDER — AMOXICILLIN 875 MG PO TABS: 875.0000 mg | ORAL_TABLET | Freq: Two times a day (BID) | ORAL | 0 refills | Status: AC

## 2021-09-03 NOTE — Discharge Instructions (Addendum)
I have provided a prescription for antibiotics in order to treat your ear infection.  Please take 1 tablet twice a day for the next 7 days.  If you experience any fever, worsening symptoms you will need to return to the ED.

## 2021-09-03 NOTE — ED Provider Triage Note (Signed)
Emergency Medicine Provider Triage Evaluation Note  Sherry Colon , a 26 y.o. female  was evaluated in triage.  Pt complains of right ear pain. States that same came on suddenly at 1 am this morning. Also endorses pain and tingling to the right side of her face. Some sinus pressure as well.  Review of Systems  Positive: R ear pain Negative: Fever, chills  Physical Exam  BP 116/75 (BP Location: Right Arm)    Pulse 95    Temp 98.6 F (37 C) (Oral)    Resp 16    Ht 5\' 2"  (1.575 m)    Wt 70.3 kg    LMP 12/12/2020    SpO2 100%    BMI 28.35 kg/m  Gen:   Awake, no distress   Resp:  Normal effort  MSK:   Moves extremities without difficulty  Other:  R TM erythematous. Some mastoid tenderness and right sided neck tenderness to palpation  Medical Decision Making  Medically screening exam initiated at 3:54 PM.  Appropriate orders placed.  Sherry Colon was informed that the remainder of the evaluation will be completed by another provider, this initial triage assessment does not replace that evaluation, and the importance of remaining in the ED until their evaluation is complete.    Sherry Nurse, PA-C 09/03/21 1556

## 2021-09-03 NOTE — ED Triage Notes (Signed)
Pt endorses right ear pain starting today. No relief with tylenol.

## 2021-09-03 NOTE — ED Provider Notes (Signed)
[facility] EMERGENCY DEPARTMENT Provider Note   CSN: [id number] Arrival date & time: 09/03/21  1500     History  Chief Complaint  Patient presents with   Otalgia    Sherry Colon is a 26 y.o. female.  26 y.o female with a PMH of recurrent ear infections presents to the ED with a chief complaint of right ear pain that began this morning.  Patient has had some upper respiratory symptoms for the past week.  She reports pain to the right ear worsened with palpation of the outer ear, does have some muffled hearing.  She has not taken any medication for improvement in her symptoms.  Does have a prior history of recurrent ear infections but has not had one in some time.  She denies any fever, chills, cough, sinus pain.  No chest pain or shortness of breath.  The history is provided by the patient and medical records.  Otalgia Location:  Right Behind ear:  Redness Quality:  Aching Onset quality:  Sudden Duration:  1 day Timing:  Constant Progression:  Worsening Chronicity:  Recurrent Context: recent URI   Relieved by:  Nothing Worsened by:  Coughing and swallowing Associated symptoms: no fever       Home Medications Prior to Admission medications   Medication Sig Start Date End Date Taking? Authorizing Provider  amoxicillin (AMOXIL) 875 MG tablet Take 1 tablet (875 mg total) by mouth 2 (two) times daily for 7 days. 09/03/21 09/10/21 Yes Hobert Poplaski, PA-C  albuterol (VENTOLIN HFA) 108 (90 Base) MCG/ACT inhaler Inhale 2 puffs into the lungs every 6 (six) hours as needed for wheezing or shortness of breath. 05/04/21   Luvenia Redden, PA-C  Blood Pressure Monitoring (BLOOD PRESSURE KIT) DEVI 1 kit by Does not apply route once a week. 03/23/21   Chancy Milroy, MD  cetirizine (ZYRTEC ALLERGY) 10 MG tablet Take 1 tablet (10 mg total) by mouth daily. 09/02/21   Gavin Pound, CNM  doxylamine, Sleep, (UNISOM SLEEPTABS) 25 MG tablet Take 1 tablet (25 mg total) by mouth at  bedtime. 09/02/21   Gavin Pound, [place]. Devices (GOJJI WEIGHT SCALE) MISC 1 Device by Does not apply route every 30 (thirty) days. 03/23/21   Chancy Milroy, MD  Prenatal Vit-Fe Fumarate-FA (PREPLUS) 27-1 MG TABS Take 1 tablet by mouth daily. 06/09/21   Chancy Milroy, MD  promethazine (PHENERGAN) 25 MG tablet Take 1 tablet (25 mg total) by mouth every 6 (six) hours as needed for nausea or vomiting. 03/23/21   Chancy Milroy, MD      Allergies    Patient has no active allergies.    Review of Systems   Review of Systems  Constitutional:  Negative for chills and fever.  HENT:  Positive for ear pain.   Respiratory:  Negative for shortness of breath.   Cardiovascular:  Negative for chest pain.  All other systems reviewed and are negative.  Physical Exam Updated Vital Signs BP 116/75 (BP Location: Right Arm)    Pulse 95    Temp 98.6 F (37 C) (Oral)    Resp 16    Ht _0  (1.575 m)    Wt 70.3 kg    LMP 12/12/2020    SpO2 100%    BMI 28.35 kg/m  Physical Exam Vitals and nursing note reviewed.  Constitutional:      Appearance: Normal appearance.  HENT:     Head: Normocephalic and atraumatic.  Right Ear: Tenderness present. There is no impacted cerumen. Tympanic membrane is injected and erythematous.     Left Ear: Tympanic membrane normal. No tenderness. There is no impacted cerumen. Tympanic membrane is not injected or erythematous.     Mouth/Throat:     Mouth: Mucous membranes are moist.  Eyes:     Pupils: Pupils are equal, round, and reactive to light.  Cardiovascular:     Rate and Rhythm: Normal rate.  Pulmonary:     Effort: Pulmonary effort is normal.  Abdominal:     General: Abdomen is flat.  Musculoskeletal:     Cervical back: Normal range of motion and neck supple.  Skin:    General: Skin is warm and dry.  Neurological:     Mental Status: She is alert and oriented to person, place, and time.    ED Results / Procedures / Treatments   Labs (all labs ordered  are listed, but only abnormal results are displayed) Labs Reviewed - No data to display  EKG None  Radiology No results found.  Procedures Procedures    Medications Ordered in ED Medications - No data to display  ED Course/ Medical Decision Making/ A&P                           Medical Decision Making  Patient presents to the ED with a chief complaint of right ear pain that began this morning.  She has been fighting a URI in the last couple of days.  She does have some nasal congestion present.  During my evaluation is overall nontoxic-appearing, afebrile, oxygen saturation is 100% on room air.  Visibility of the left TM without any abnormality or injection or erythema.  Visibility of the right TM remarkable for erythema, injection, tenderness.  Somewhat full sounds reported by patient.  I suspicion for otitis media at this time.  Will provide her a prescription of amoxicillin as she has not had any antibiotics in the last month.  She understands and agrees to management, patient stable for discharge  Portions of this note were generated with Lobby[employer]. Dictation errors may occur despite best attempts at proofreading.   Final Clinical Impression(s) / ED Diagnoses Final diagnoses:  Acute otitis media, unspecified otitis media type    Rx / DC Orders ED Discharge Orders          Ordered    amoxicillin (AMOXIL) 875 MG tablet  2 times daily        09/03/21 1742              Janeece Fitting, PA-C 09/03/21 1745    Drenda Freeze, MD 09/04/21 1455

## 2021-09-15 ENCOUNTER — Telehealth (INDEPENDENT_AMBULATORY_CARE_PROVIDER_SITE_OTHER): Payer: Medicare Other

## 2021-09-15 DIAGNOSIS — Z3480 Encounter for supervision of other normal pregnancy, unspecified trimester: Secondary | ICD-10-CM

## 2021-09-15 DIAGNOSIS — Z3A34 34 weeks gestation of pregnancy: Secondary | ICD-10-CM

## 2021-09-15 NOTE — Progress Notes (Signed)
° °  OBSTETRICS PRENATAL VIRTUAL VISIT ENCOUNTER NOTE  [facility]   Patient location: Home  I connected with Glade Nurse on 09/15/21 at  2:30 PM EST by MyChart Video Encounter and verified that I am speaking with the correct person using two identifiers. I discussed the limitations, risks, security and privacy concerns of performing an evaluation and management service virtually and the availability of in person appointments. I also discussed with the patient that there may be a patient responsible charge related to this service. The patient expressed understanding and agreed to proceed. Subjective:  Sherry Colon is a 26 y.o. G2P1001 at [redacted]w[redacted]d being seen today for ongoing prenatal care.  She is currently monitored for the following issues for this low-risk pregnancy and has COVID-19 virus detected; Supervision of other normal pregnancy, antepartum; Asthma affecting pregnancy in second trimester; Seizures (HCC); Fibromyalgia; Chlamydia infection affecting pregnancy in second trimester, antepartum; Abnormal finding on antenatal screen; and Trichomonal vaginitis during pregnancy in second trimester on their problem list.  Patient reports backache.  She reports the pain is present with movement/ambulation and improves with laying down. She endorses fetal movement. She endorses vaginal discharge, but reports it is normal. Denies any leaking of fluid.   She does not have a blood pressure cuff.  She denies HA, visual disturbances, SOB, and edema.   The following portions of the patient's history were reviewed and updated as appropriate: allergies, current medications, past family history, past medical history, past social history, past surgical history and problem list.   Objective:  There were no vitals filed for this visit.  Fetal Status:           General:  Alert, oriented and cooperative. Patient is in no acute distress.  Respiratory: Normal  respiratory effort, no problems with respiration noted  Mental Status: Normal mood and affect. Normal behavior. Normal judgment and thought content.  Rest of physical exam deferred due to type of encounter  Imaging: No results found.  Assessment and Plan:  Pregnancy: G2P1001 at [redacted]w[redacted]d  1. Supervision of other normal pregnancy, antepartum -Anticipatory guidance for upcoming appts. -Patient to schedule next appt in 2 weeks for an in-person visit. -Labs as below. -Educated on GBS bacteria including what it is, why we test, and how and when we treat if needed.  2. [redacted] weeks gestation of pregnancy -Doing well -Reviewed c/o back pain. -Encouraged to monitor and report if no relief with current measures.    Preterm labor symptoms and general obstetric precautions including but not limited to vaginal bleeding, contractions, leaking of fluid and fetal movement were reviewed in detail with the patient. I discussed the assessment and treatment plan with the patient. The patient was provided an opportunity to ask questions and all were answered. The patient agreed with the plan and demonstrated an understanding of the instructions. The patient was advised to call back or seek an in-person office evaluation/go to [facility]U at [facility] for any urgent or concerning symptoms. Please refer to After Visit Summary for other counseling recommendations.   I provided 5 minutes of face-to-face time during this encounter.  No follow-ups on file.  Future Appointments  Date Time Provider Department Center  09/29/2021  1:30 PM Gerrit Heck, CNM [facility] None    Cherre Robins, CNM [facility] [employer]

## 2021-09-29 ENCOUNTER — Other Ambulatory Visit (HOSPITAL_COMMUNITY)
Admission: RE | Admit: 2021-09-29 | Discharge: 2021-09-29 | Disposition: A | Discharge status: Home / Self Care | Payer: Medicare Other | Source: Ambulatory Visit

## 2021-09-29 ENCOUNTER — Other Ambulatory Visit: Payer: Self-pay

## 2021-09-29 ENCOUNTER — Ambulatory Visit (INDEPENDENT_AMBULATORY_CARE_PROVIDER_SITE_OTHER): Payer: Medicare Other

## 2021-09-29 VITALS — BP 116/67 | HR 96 | Wt 157.0 lb

## 2021-09-29 DIAGNOSIS — Z3480 Encounter for supervision of other normal pregnancy, unspecified trimester: Secondary | ICD-10-CM

## 2021-09-29 DIAGNOSIS — A749 Chlamydial infection, unspecified: Secondary | ICD-10-CM

## 2021-09-29 DIAGNOSIS — Z3A36 36 weeks gestation of pregnancy: Secondary | ICD-10-CM | POA: Diagnosis present

## 2021-09-29 DIAGNOSIS — O98813 Other maternal infectious and parasitic diseases complicating pregnancy, third trimester: Secondary | ICD-10-CM

## 2021-09-29 NOTE — Progress Notes (Signed)
Pt presents for ROB, GBS, and GC/CT.  PHQ9= 1 GAD7= 1

## 2021-09-29 NOTE — Progress Notes (Signed)
° °  LOW-RISK PREGNANCY OFFICE VISIT  Patient name: Sherry Colon MRN [id number]  Date of birth: August 25, 1996 Chief Complaint:   Routine Prenatal Visit  Subjective:   Sherry Colon is a 26 y.o. G78P1001 female at [redacted]w[redacted]d with an Estimated Date of Delivery: 10/24/21 being seen today for ongoing management of a low-risk pregnancy aeb has COVID-19 virus detected; Supervision of other normal pregnancy, antepartum; Asthma affecting pregnancy in second trimester; Seizures (HCC); Fibromyalgia; Chlamydia infection affecting pregnancy in second trimester, antepartum; Abnormal finding on antenatal screen; and Trichomonal vaginitis during pregnancy in second trimester on their problem list.  Patient presents today with  pelvic pressure and occasional cramping .  Patient endorses fetal movement. Patient denies contractions.  Patient denies vaginal concerns including abnormal discharge, leaking of fluid, and bleeding.  Contractions: Irritability. Vag. Bleeding: None.  Movement: Present.  Reviewed past medical,surgical, social, obstetrical and family history as well as problem list, medications and allergies.  Objective   Vitals:   09/29/21 1330  BP: 116/67  Pulse: 96  Weight: 157 lb (71.2 kg)  Body mass index is 28.72 kg/m.  Total Weight Gain:38 lb (17.2 kg)         Physical Examination:   General appearance: Well appearing, and in no distress  Mental status: Alert, oriented to person, place, and time  Skin: Warm & dry  Cardiovascular: Normal heart rate noted  Respiratory: Normal respiratory effort, no distress  Abdomen: Soft, gravid, nontender, AGA with Fundal Height: 36 cm  Pelvic: Cervical exam deferred           Extremities: Edema: None  Fetal Status: Fetal Heart Rate (bpm): 136  Movement: Present   No results found for this or any previous visit (from the past 24 hour(s)).  Assessment & Plan:  Low-risk pregnancy of a 26 y.o., G2P1001 at [redacted]w[redacted]d with an Estimated Date of Delivery: 10/24/21    1. Supervision of other normal pregnancy, antepartum -Anticipatory guidance for upcoming appts. -Patient to schedule next appt in 1 weeks for an in-person visit. -Informed that she can do virtual visits if desired, but should do so every other week. -Labs as below. -Educated on GBS bacteria including what it is, why we test, and how and when we treat if needed. -Discussed releasing of results to mychart. -Patient declines cervical exam. -Fetus Cephalic by [place]  2. [redacted] weeks gestation of pregnancy -Doing well. -Discussed c/o pelvic pain and cramping. Reassured normal during late third trimester. -Reviewed desires for postpartum contraception. -Has used depo provera in past and would like to start at [facility].      Meds: No orders of the defined types were placed in this encounter.  Labs/procedures today:  Lab Orders  No laboratory test(s) ordered today     Reviewed: Term labor symptoms and general obstetric precautions including but not limited to vaginal bleeding, contractions, leaking of fluid and fetal movement were reviewed in detail with the patient.  All questions were answered.  Follow-up: Return in about 1 week (around 10/06/2021).  No orders of the defined types were placed in this encounter.  Cherre Robins MSN, CNM 09/29/2021

## 2021-09-30 LAB — CERVICOVAGINAL ANCILLARY ONLY
Chlamydia: POSITIVE — AB
Comment: NEGATIVE
Comment: NORMAL
Neisseria Gonorrhea: NEGATIVE

## 2021-09-30 MED ORDER — AZITHROMYCIN 500 MG PO TABS: 1000.0000 mg | ORAL_TABLET | Freq: Once | ORAL | 0 refills | Status: DC

## 2021-09-30 NOTE — Addendum Note (Signed)
Addended by: Gerrit Heck L on: 09/30/2021 08:26 PM   Modules accepted: Orders

## 2021-10-01 DIAGNOSIS — B951 Streptococcus, group B, as the cause of diseases classified elsewhere: Secondary | ICD-10-CM

## 2021-10-01 HISTORY — DX: Streptococcus, group b, as the cause of diseases classified elsewhere: B95.1

## 2021-10-01 LAB — STREP GP B NAA: Strep Gp B NAA: POSITIVE — AB

## 2021-10-06 ENCOUNTER — Other Ambulatory Visit: Payer: Self-pay

## 2021-10-06 ENCOUNTER — Encounter: Payer: Self-pay | Admitting: Obstetrics

## 2021-10-06 ENCOUNTER — Ambulatory Visit (INDEPENDENT_AMBULATORY_CARE_PROVIDER_SITE_OTHER): Payer: Medicare Other | Admitting: Obstetrics

## 2021-10-06 VITALS — BP 113/72 | HR 98 | Wt 162.1 lb

## 2021-10-06 DIAGNOSIS — A749 Chlamydial infection, unspecified: Secondary | ICD-10-CM

## 2021-10-06 DIAGNOSIS — M797 Fibromyalgia: Secondary | ICD-10-CM

## 2021-10-06 DIAGNOSIS — O23592 Infection of other part of genital tract in pregnancy, second trimester: Secondary | ICD-10-CM

## 2021-10-06 DIAGNOSIS — J45909 Unspecified asthma, uncomplicated: Secondary | ICD-10-CM

## 2021-10-06 DIAGNOSIS — O98813 Other maternal infectious and parasitic diseases complicating pregnancy, third trimester: Secondary | ICD-10-CM

## 2021-10-06 DIAGNOSIS — Z3480 Encounter for supervision of other normal pregnancy, unspecified trimester: Secondary | ICD-10-CM

## 2021-10-06 DIAGNOSIS — O99512 Diseases of the respiratory system complicating pregnancy, second trimester: Secondary | ICD-10-CM

## 2021-10-06 DIAGNOSIS — O289 Unspecified abnormal findings on antenatal screening of mother: Secondary | ICD-10-CM

## 2021-10-06 DIAGNOSIS — A5901 Trichomonal vulvovaginitis: Secondary | ICD-10-CM

## 2021-10-06 DIAGNOSIS — R569 Unspecified convulsions: Secondary | ICD-10-CM

## 2021-10-06 MED ORDER — AZITHROMYCIN 500 MG PO TABS: 1000.0000 mg | ORAL_TABLET | Freq: Once | ORAL | 0 refills | Status: AC

## 2021-10-06 MED ORDER — PREPLUS 27-1 MG PO TABS: 1.0000 | ORAL_TABLET | Freq: Every day | ORAL | 13 refills | Status: DC

## 2021-10-06 NOTE — Progress Notes (Signed)
Presents for ROB. Has not taken tx for Chlamydia, states she transferred pharmacy. Pharmacy changed in chart, pt states she will pick up today. Barkley Boards, RN

## 2021-10-06 NOTE — Progress Notes (Addendum)
Subjective:  Sherry Colon is a 26 y.o. G2P1001 at [redacted]w[redacted]d being seen today for ongoing prenatal care.  She is currently monitored for the following issues for this low-risk pregnancy and has COVID-19 virus detected; Supervision of other normal pregnancy, antepartum; Asthma affecting pregnancy in second trimester; Seizures ([place]); Fibromyalgia; Chlamydia infection affecting pregnancy in third trimester, antepartum; Abnormal finding on antenatal screen; Trichomonal vaginitis during pregnancy in second trimester; and Positive GBS test on their problem list.  Patient reports no complaints.  She tested positive for chlamydia, but has not picked up her Rx for azithromycin yet.  Contractions: Not present. Vag. Bleeding: None.  Movement: Present. Denies leaking of fluid.   The following portions of the patient's history were reviewed and updated as appropriate: allergies, current medications, past family history, past medical history, past social history, past surgical history and problem list. Problem list updated.  Objective:   Vitals:   10/06/21 1558  BP: 113/72  Pulse: 98  Weight: 162 lb 1.6 oz (73.5 kg)    Fetal Status:     Movement: Present     General:  Alert, oriented and cooperative. Patient is in no acute distress.  Skin: Skin is warm and dry. No rash noted.   Cardiovascular: Normal heart rate noted  Respiratory: Normal respiratory effort, no problems with respiration noted  Abdomen: Soft, gravid, appropriate for gestational age. Pain/Pressure: Absent     Pelvic:  Cervical exam deferred        Extremities: Normal range of motion.  Edema: None  Mental Status: Normal mood and affect. Normal behavior. Normal judgment and thought content.   Urinalysis:      Assessment and Plan:  Pregnancy: G2P1001 at [redacted]w[redacted]d  1. Supervision of other normal pregnancy, antepartum Rx: - Prenatal Vit-Fe Fumarate-FA (PREPLUS) 27-1 MG TABS; Take 1 tablet by mouth daily.  Dispense: 30 tablet; Refill: 13  2.  Chlamydia infection affecting pregnancy in third trimester, antepartum Rx: - azithromycin (ZITHROMAX) 500 MG tablet; Take 2 tablets (1,000 mg total) by mouth once for 1 dose.  Dispense: 2 tablet; Refill: 0  3. Trichomonal vaginitis during pregnancy in second trimester - treated  4. Abnormal finding on antenatal screen - referred to MFM  5. Asthma affecting pregnancy in second trimester - clinically stable  6. Seizures ([facility]) - clinically stable  7. Fibromyalgia - clinically stable  Preterm labor symptoms and general obstetric precautions including but not limited to vaginal bleeding, contractions, leaking of fluid and fetal movement were reviewed in detail with the patient. Please refer to After Visit Summary for other counseling recommendations.  Return in about 1 week (around 10/13/2021) for ROB.   Shelly Bombard, MD  10/06/21

## 2021-10-09 ENCOUNTER — Encounter: Payer: Self-pay | Admitting: Obstetrics

## 2021-10-13 ENCOUNTER — Other Ambulatory Visit: Payer: Self-pay

## 2021-10-13 ENCOUNTER — Ambulatory Visit (INDEPENDENT_AMBULATORY_CARE_PROVIDER_SITE_OTHER): Payer: Medicare Other | Admitting: Obstetrics

## 2021-10-13 ENCOUNTER — Encounter: Payer: Self-pay | Admitting: Obstetrics

## 2021-10-13 VITALS — BP 115/74 | HR 94 | Wt 164.9 lb

## 2021-10-13 DIAGNOSIS — O98813 Other maternal infectious and parasitic diseases complicating pregnancy, third trimester: Secondary | ICD-10-CM

## 2021-10-13 DIAGNOSIS — A749 Chlamydial infection, unspecified: Secondary | ICD-10-CM

## 2021-10-13 DIAGNOSIS — A5901 Trichomonal vulvovaginitis: Secondary | ICD-10-CM

## 2021-10-13 DIAGNOSIS — O23592 Infection of other part of genital tract in pregnancy, second trimester: Secondary | ICD-10-CM

## 2021-10-13 DIAGNOSIS — Z3480 Encounter for supervision of other normal pregnancy, unspecified trimester: Secondary | ICD-10-CM

## 2021-10-13 NOTE — Progress Notes (Signed)
2/2 positive chlamydia. Problems getting meds. Took meds Feb 14th. Reports partner was treated.

## 2021-10-13 NOTE — Progress Notes (Signed)
Subjective:  Sherry Colon is a 26 y.o. G2P1001 at [redacted]w[redacted]d being seen today for ongoing prenatal care.  She is currently monitored for the following issues for this low-risk pregnancy and has COVID-19 virus detected; Supervision of other normal pregnancy, antepartum; Asthma affecting pregnancy in second trimester; Seizures ([place]); Fibromyalgia; Chlamydia infection affecting pregnancy in third trimester, antepartum; Abnormal finding on antenatal screen; Trichomonal vaginitis during pregnancy in second trimester; and Positive GBS test on their problem list.  Patient reports no complaints.  Contractions: Irritability. Vag. Bleeding: None.  Movement: Present. Denies leaking of fluid.   The following portions of the patient's history were reviewed and updated as appropriate: allergies, current medications, past family history, past medical history, past social history, past surgical history and problem list. Problem list updated.  Objective:   Vitals:   10/13/21 0948  BP: 115/74  Pulse: 94  Weight: 164 lb 14.4 oz (74.8 kg)    Fetal Status: Fetal Heart Rate (bpm): 134   Movement: Present     General:  Alert, oriented and cooperative. Patient is in no acute distress.  Skin: Skin is warm and dry. No rash noted.   Cardiovascular: Normal heart rate noted  Respiratory: Normal respiratory effort, no problems with respiration noted  Abdomen: Soft, gravid, appropriate for gestational age. Pain/Pressure: Present     Pelvic:  Cervical exam deferred        Extremities: Normal range of motion.  Edema: None  Mental Status: Normal mood and affect. Normal behavior. Normal judgment and thought content.   Urinalysis:      Assessment and Plan:  Pregnancy: G2P1001 at [redacted]w[redacted]d  1. Supervision of other normal pregnancy, antepartum  2. Chlamydia infection affecting pregnancy in third trimester, antepartum - treated on 10-11-2021  3. Trichomonal vaginitis during pregnancy in second trimester - treated, and  negative TOC on 09-02-2021    Term labor symptoms and general obstetric precautions including but not limited to vaginal bleeding, contractions, leaking of fluid and fetal movement were reviewed in detail with the patient. Please refer to After Visit Summary for other counseling recommendations.   Return in about 1 week (around 10/20/2021) for ROB.   Shelly Bombard, MD  10/13/21

## 2021-10-19 ENCOUNTER — Other Ambulatory Visit: Payer: Self-pay

## 2021-10-19 ENCOUNTER — Ambulatory Visit (INDEPENDENT_AMBULATORY_CARE_PROVIDER_SITE_OTHER): Payer: Medicare Other | Admitting: Advanced Practice Midwife

## 2021-10-19 VITALS — BP 107/70 | HR 75 | Wt 169.5 lb

## 2021-10-19 DIAGNOSIS — O99512 Diseases of the respiratory system complicating pregnancy, second trimester: Secondary | ICD-10-CM

## 2021-10-19 DIAGNOSIS — A5901 Trichomonal vulvovaginitis: Secondary | ICD-10-CM

## 2021-10-19 DIAGNOSIS — R569 Unspecified convulsions: Secondary | ICD-10-CM

## 2021-10-19 DIAGNOSIS — Z3480 Encounter for supervision of other normal pregnancy, unspecified trimester: Secondary | ICD-10-CM

## 2021-10-19 DIAGNOSIS — J45909 Unspecified asthma, uncomplicated: Secondary | ICD-10-CM

## 2021-10-19 DIAGNOSIS — A749 Chlamydial infection, unspecified: Secondary | ICD-10-CM

## 2021-10-19 DIAGNOSIS — O98813 Other maternal infectious and parasitic diseases complicating pregnancy, third trimester: Secondary | ICD-10-CM

## 2021-10-19 DIAGNOSIS — O289 Unspecified abnormal findings on antenatal screening of mother: Secondary | ICD-10-CM

## 2021-10-19 DIAGNOSIS — M797 Fibromyalgia: Secondary | ICD-10-CM

## 2021-10-19 DIAGNOSIS — O23592 Infection of other part of genital tract in pregnancy, second trimester: Secondary | ICD-10-CM

## 2021-10-19 NOTE — Progress Notes (Signed)
Pt presents for ROB visit. She does not have any concerns at this time.

## 2021-10-19 NOTE — Patient Instructions (Signed)
Labor Precautions Reasons to come to MAU at [facility]:  1.  Contractions are  5 minutes apart or less, each last 1 minute, these have been going on for 1-2 hours, and you cannot walk or talk during them 2.  You have a large gush of fluid, or a trickle of fluid that will not stop and you have to wear a pad 3.  You have bleeding that is bright red, heavier than spotting--like menstrual bleeding (spotting can be normal in early labor or after a check of your cervix) 4.  You do not feel the baby moving like he/she normally does  

## 2021-10-19 NOTE — Progress Notes (Signed)
° °  PRENATAL VISIT NOTE  Subjective:  Sherry Colon is a 26 y.o. G2P1001 at [redacted]w[redacted]d being seen today for ongoing prenatal care.  She is currently monitored for the following issues for this low-risk pregnancy and has Supervision of other normal pregnancy, antepartum; Asthma affecting pregnancy in second trimester; Seizures ([place]); Fibromyalgia; Chlamydia infection affecting pregnancy in third trimester, antepartum; Abnormal finding on antenatal screen; Trichomonal vaginitis during pregnancy in second trimester; and Positive GBS test on their problem list.  Patient reports occasional contractions.  Contractions: Irritability. Vag. Bleeding: None.  Movement: Present. Denies leaking of fluid.   The following portions of the patient's history were reviewed and updated as appropriate: allergies, current medications, past family history, past medical history, past social history, past surgical history and problem list.   Objective:   Vitals:   10/19/21 1556  BP: 107/70  Pulse: 75  Weight: 169 lb 8 oz (76.9 kg)    Fetal Status: Fetal Heart Rate (bpm): 148   Movement: Present     General:  Alert, oriented and cooperative. Patient is in no acute distress.  Skin: Skin is warm and dry. No rash noted.   Cardiovascular: Normal heart rate noted  Respiratory: Normal respiratory effort, no problems with respiration noted  Abdomen: Soft, gravid, appropriate for gestational age.  Pain/Pressure: Absent     Pelvic: Cervical exam performed in the presence of a chaperone        Extremities: Normal range of motion.  Edema: None  Mental Status: Normal mood and affect. Normal behavior. Normal judgment and thought content.   Assessment and Plan:  Pregnancy: G2P1001 at [redacted]w[redacted]d 1. Chlamydia infection affecting pregnancy in third trimester, antepartum --Positive in early preg, TOC negative --Positive at 36 weeks, Rx sent 2/4 but problems with pharmacy so pt took medication 10/11/10.  TOC 3/7 or later needed.  2.  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 Asthma affecting pregnancy in second trimester --stable  4. Seizures ([place]) --Unknown cause, occurs with overheating  No medications   5. Fibromyalgia   6. Supervision of other normal pregnancy, antepartum --Anticipatory guidance about next visits/weeks of pregnancy given.  --Membranes swept at pt request. Pt 4/50/-2, posterior. --Labor precautions reviewed  7. Trichomonal vaginitis during pregnancy in second trimester --negative 08/2021  Term labor symptoms and general obstetric precautions including but not limited to vaginal bleeding, contractions, leaking of fluid and fetal movement were reviewed in detail with the patient. Please refer to After Visit Summary for other counseling recommendations.   No follow-ups on file.  No future appointments.  Fatima Blank, CNM

## 2021-10-20 ENCOUNTER — Encounter (HOSPITAL_COMMUNITY): Payer: Self-pay | Admitting: Obstetrics & Gynecology

## 2021-10-20 ENCOUNTER — Inpatient Hospital Stay (HOSPITAL_COMMUNITY)
Admission: AD | Admit: 2021-10-20 | Discharge: 2021-10-22 | DRG: 807 | Disposition: A | Discharge status: Home / Self Care | Payer: Medicare Other | Attending: Obstetrics & Gynecology | Admitting: Obstetrics & Gynecology

## 2021-10-20 DIAGNOSIS — O9952 Diseases of the respiratory system complicating childbirth: Secondary | ICD-10-CM | POA: Diagnosis present

## 2021-10-20 DIAGNOSIS — J45909 Unspecified asthma, uncomplicated: Secondary | ICD-10-CM | POA: Diagnosis present

## 2021-10-20 DIAGNOSIS — O99824 Streptococcus B carrier state complicating childbirth: Principal | ICD-10-CM | POA: Diagnosis present

## 2021-10-20 DIAGNOSIS — O479 False labor, unspecified: Secondary | ICD-10-CM | POA: Diagnosis present

## 2021-10-20 DIAGNOSIS — M797 Fibromyalgia: Secondary | ICD-10-CM

## 2021-10-20 DIAGNOSIS — O289 Unspecified abnormal findings on antenatal screening of mother: Secondary | ICD-10-CM

## 2021-10-20 DIAGNOSIS — B951 Streptococcus, group B, as the cause of diseases classified elsewhere: Secondary | ICD-10-CM | POA: Diagnosis present

## 2021-10-20 DIAGNOSIS — O26893 Other specified pregnancy related conditions, third trimester: Secondary | ICD-10-CM | POA: Diagnosis present

## 2021-10-20 DIAGNOSIS — Z3480 Encounter for supervision of other normal pregnancy, unspecified trimester: Secondary | ICD-10-CM

## 2021-10-20 DIAGNOSIS — O98813 Other maternal infectious and parasitic diseases complicating pregnancy, third trimester: Secondary | ICD-10-CM

## 2021-10-20 DIAGNOSIS — Z3A39 39 weeks gestation of pregnancy: Secondary | ICD-10-CM | POA: Diagnosis not present

## 2021-10-20 DIAGNOSIS — O9832 Other infections with a predominantly sexual mode of transmission complicating childbirth: Secondary | ICD-10-CM

## 2021-10-20 DIAGNOSIS — Z20822 Contact with and (suspected) exposure to covid-19: Secondary | ICD-10-CM | POA: Diagnosis present

## 2021-10-20 DIAGNOSIS — R569 Unspecified convulsions: Secondary | ICD-10-CM

## 2021-10-20 DIAGNOSIS — O23592 Infection of other part of genital tract in pregnancy, second trimester: Secondary | ICD-10-CM

## 2021-10-20 DIAGNOSIS — A5901 Trichomonal vulvovaginitis: Secondary | ICD-10-CM

## 2021-10-20 DIAGNOSIS — A749 Chlamydial infection, unspecified: Secondary | ICD-10-CM | POA: Diagnosis present

## 2021-10-20 LAB — COMPREHENSIVE METABOLIC PANEL
ALT: 10 U/L (ref 0–44)
AST: 17 U/L (ref 15–41)
Albumin: 2.8 g/dL — ABNORMAL LOW (ref 3.5–5.0)
Alkaline Phosphatase: 124 U/L (ref 38–126)
Anion gap: 10 (ref 5–15)
BUN: 10 mg/dL (ref 6–20)
CO2: 18 mmol/L — ABNORMAL LOW (ref 22–32)
Calcium: 8.7 mg/dL — ABNORMAL LOW (ref 8.9–10.3)
Chloride: 107 mmol/L (ref 98–111)
Creatinine, Ser: 0.67 mg/dL (ref 0.44–1.00)
GFR, Estimated: >60 mL/min (ref >60)
Glucose, Bld: 99 mg/dL (ref 70–99)
Potassium: 3.6 mmol/L (ref 3.5–5.1)
Sodium: 135 mmol/L (ref 135–145)
Total Bilirubin: 0.3 mg/dL (ref 0.3–1.2)
Total Protein: 6.2 g/dL — ABNORMAL LOW (ref 6.5–8.1)

## 2021-10-20 LAB — CBC
HCT: 37.1 % (ref 36.0–46.0)
Hemoglobin: 11.8 g/dL — ABNORMAL LOW (ref 12.0–15.0)
MCH: 29.1 pg (ref 26.0–34.0)
MCHC: 31.8 g/dL (ref 30.0–36.0)
MCV: 91.6 fL (ref 80.0–100.0)
Platelets: 253 10*3/uL (ref 150–400)
RBC: 4.05 MIL/uL (ref 3.87–5.11)
RDW: 14.7 % (ref 11.5–15.5)
WBC: 8 10*3/uL (ref 4.0–10.5)
nRBC: 0 % (ref 0.0–0.2)

## 2021-10-20 LAB — RESP PANEL BY RT-PCR (FLU A&B, COVID) ARPGX2
Influenza A by PCR: NEGATIVE
Influenza B by PCR: NEGATIVE
SARS Coronavirus 2 by RT PCR: NEGATIVE

## 2021-10-20 LAB — PROTEIN / CREATININE RATIO, URINE
Creatinine, Urine: 90.2 mg/dL
Protein Creatinine Ratio: 0.3 mg/mg{Cre} — ABNORMAL HIGH (ref 0.00–0.15)
Total Protein, Urine: 27 mg/dL

## 2021-10-20 LAB — TYPE AND SCREEN
ABO/RH(D): B POS
Antibody Screen: NEGATIVE

## 2021-10-20 LAB — RPR: RPR Ser Ql: NONREACTIVE

## 2021-10-20 MED ORDER — TRANEXAMIC ACID-NACL 1000-0.7 MG/100ML-% IV SOLN
INTRAVENOUS | Status: AC
  Administered 2021-10-20: 1000 mg
  Filled 2021-10-20: qty 100

## 2021-10-20 MED ORDER — CLINDAMYCIN PHOSPHATE 900 MG/50ML IV SOLN: 900.0000 mg | Freq: Once | INTRAVENOUS | Status: DC

## 2021-10-20 MED ORDER — METHYLERGONOVINE MALEATE 0.2 MG/ML IJ SOLN
INTRAMUSCULAR | Status: AC
  Filled 2021-10-20: qty 1

## 2021-10-20 MED ORDER — DIPHENHYDRAMINE HCL 25 MG PO CAPS: 25.0000 mg | ORAL_CAPSULE | Freq: Four times a day (QID) | ORAL | Status: DC | PRN

## 2021-10-20 MED ORDER — LACTATED RINGERS IV SOLN: 500.0000 mL | INTRAVENOUS | Status: DC | PRN

## 2021-10-20 MED ORDER — ONDANSETRON HCL 4 MG/2ML IJ SOLN: 4.0000 mg | Freq: Four times a day (QID) | INTRAMUSCULAR | Status: DC | PRN

## 2021-10-20 MED ORDER — LIDOCAINE HCL (PF) 1 % IJ SOLN
30.0000 mL | INTRAMUSCULAR | Status: DC | PRN
  Filled 2021-10-20: qty 30

## 2021-10-20 MED ORDER — SODIUM CHLORIDE 0.9 % IV SOLN: 5.0000 10*6.[IU] | Freq: Once | INTRAVENOUS | Status: DC

## 2021-10-20 MED ORDER — MEASLES, MUMPS & RUBELLA VAC IJ SOLR: 0.5000 mL | Freq: Once | INTRAMUSCULAR | Status: DC

## 2021-10-20 MED ORDER — FENTANYL-BUPIVACAINE-NACL 0.5-0.125-0.9 MG/250ML-% EP SOLN
EPIDURAL | Status: AC
  Filled 2021-10-20: qty 250

## 2021-10-20 MED ORDER — TETANUS-DIPHTH-ACELL PERTUSSIS 5-2.5-18.5 LF-MCG/0.5 IM SUSY: 0.5000 mL | PREFILLED_SYRINGE | Freq: Once | INTRAMUSCULAR | Status: DC

## 2021-10-20 MED ORDER — SOD CITRATE-CITRIC ACID 500-334 MG/5ML PO SOLN: 30.0000 mL | ORAL | Status: DC | PRN

## 2021-10-20 MED ORDER — IBUPROFEN 600 MG PO TABS
600.0000 mg | ORAL_TABLET | Freq: Four times a day (QID) | ORAL | Status: DC
  Administered 2021-10-20 – 2021-10-22 (×9): 600 mg via ORAL
  Filled 2021-10-20 (×9): qty 1

## 2021-10-20 MED ORDER — SENNOSIDES-DOCUSATE SODIUM 8.6-50 MG PO TABS
2.0000 | ORAL_TABLET | ORAL | Status: DC
  Administered 2021-10-21: 2 via ORAL
  Filled 2021-10-20 (×2): qty 2

## 2021-10-20 MED ORDER — BENZOCAINE-MENTHOL 20-0.5 % EX AERO
1.0000 "application " | INHALATION_SPRAY | CUTANEOUS | Status: DC | PRN
  Administered 2021-10-20: 1 via TOPICAL
  Filled 2021-10-20: qty 56

## 2021-10-20 MED ORDER — COCONUT OIL OIL: 1.0000 "application " | TOPICAL_OIL | Status: DC | PRN

## 2021-10-20 MED ORDER — EPHEDRINE 5 MG/ML INJ: 10.0000 mg | INTRAVENOUS | Status: DC | PRN

## 2021-10-20 MED ORDER — FENTANYL CITRATE (PF) 100 MCG/2ML IJ SOLN
100.0000 ug | INTRAMUSCULAR | Status: DC | PRN
  Administered 2021-10-20: 100 ug via INTRAVENOUS
  Filled 2021-10-20: qty 2

## 2021-10-20 MED ORDER — OXYTOCIN-SODIUM CHLORIDE 30-0.9 UT/500ML-% IV SOLN
2.5000 [IU]/h | INTRAVENOUS | Status: DC
  Administered 2021-10-20: 2.5 [IU]/h via INTRAVENOUS

## 2021-10-20 MED ORDER — OXYTOCIN BOLUS FROM INFUSION
333.0000 mL | Freq: Once | INTRAVENOUS | Status: DC
  Administered 2021-10-20: 333 mL via INTRAVENOUS

## 2021-10-20 MED ORDER — PENICILLIN G POT IN DEXTROSE 60000 UNIT/ML IV SOLN: 3.0000 10*6.[IU] | INTRAVENOUS | Status: DC

## 2021-10-20 MED ORDER — ACETAMINOPHEN 325 MG PO TABS
650.0000 mg | ORAL_TABLET | ORAL | Status: DC | PRN
  Administered 2021-10-20 (×2): 650 mg via ORAL
  Filled 2021-10-20 (×2): qty 2

## 2021-10-20 MED ORDER — ACETAMINOPHEN 325 MG PO TABS
650.0000 mg | ORAL_TABLET | ORAL | Status: DC | PRN
Start: 2021-10-20 — End: 2021-10-20

## 2021-10-20 MED ORDER — ONDANSETRON HCL 4 MG/2ML IJ SOLN: 4.0000 mg | INTRAMUSCULAR | Status: DC | PRN

## 2021-10-20 MED ORDER — PHENYLEPHRINE 40 MCG/ML (10ML) SYRINGE FOR IV PUSH (FOR BLOOD PRESSURE SUPPORT): 80.0000 ug | PREFILLED_SYRINGE | INTRAVENOUS | Status: DC | PRN

## 2021-10-20 MED ORDER — DIBUCAINE (PERIANAL) 1 % EX OINT: 1.0000 "application " | TOPICAL_OINTMENT | CUTANEOUS | Status: DC | PRN

## 2021-10-20 MED ORDER — ONDANSETRON HCL 4 MG PO TABS: 4.0000 mg | ORAL_TABLET | ORAL | Status: DC | PRN

## 2021-10-20 MED ORDER — OXYCODONE-ACETAMINOPHEN 5-325 MG PO TABS: 1.0000 | ORAL_TABLET | ORAL | Status: DC | PRN

## 2021-10-20 MED ORDER — SODIUM CHLORIDE 0.9 % IV SOLN
2.0000 g | Freq: Once | INTRAVENOUS | Status: AC
  Administered 2021-10-20: 2 g via INTRAVENOUS
  Filled 2021-10-20: qty 2000

## 2021-10-20 MED ORDER — WITCH HAZEL-GLYCERIN EX PADS: 1.0000 "application " | MEDICATED_PAD | CUTANEOUS | Status: DC | PRN

## 2021-10-20 MED ORDER — FENTANYL-BUPIVACAINE-NACL 0.5-0.125-0.9 MG/250ML-% EP SOLN: 12.0000 mL/h | EPIDURAL | Status: DC | PRN

## 2021-10-20 MED ORDER — LACTATED RINGERS IV SOLN: 500.0000 mL | Freq: Once | INTRAVENOUS | Status: DC

## 2021-10-20 MED ORDER — SIMETHICONE 80 MG PO CHEW
80.0000 mg | CHEWABLE_TABLET | ORAL | Status: DC | PRN
  Filled 2021-10-20: qty 1

## 2021-10-20 MED ORDER — PRENATAL MULTIVITAMIN CH
1.0000 | ORAL_TABLET | Freq: Every day | ORAL | Status: DC
  Administered 2021-10-21 – 2021-10-22 (×2): 1 via ORAL
  Filled 2021-10-20 (×2): qty 1

## 2021-10-20 MED ORDER — DIPHENHYDRAMINE HCL 50 MG/ML IJ SOLN: 12.5000 mg | INTRAMUSCULAR | Status: DC | PRN

## 2021-10-20 MED ORDER — LACTATED RINGERS IV SOLN: INTRAVENOUS | Status: DC

## 2021-10-20 MED ORDER — OXYCODONE-ACETAMINOPHEN 5-325 MG PO TABS: 2.0000 | ORAL_TABLET | ORAL | Status: DC | PRN

## 2021-10-20 MED ORDER — SODIUM CHLORIDE 0.9 % IV SOLN: 1.0000 g | INTRAVENOUS | Status: DC

## 2021-10-20 NOTE — MAU Note (Signed)
RN called [facility] Phlebotomist to collect patients admission lab work as patient has an IV that was started with EMS. Phlebotomist on Peds Unit and to go to room 213 when finished.

## 2021-10-20 NOTE — Lactation Note (Signed)
This note was copied from a baby's chart. Lactation Consultation Note  Patient Name: Sherry Colon [id number] Date: 10/20/2021 Reason for consult: L&D Initial assessment Age:25 hours  P2, Baby cueing upon entering. Assisted with guiding baby to breast. Lactation to follow up on MBU.    LATCH Score Latch: Grasps breast easily, tongue down, lips flanged, rhythmical sucking.  Audible Swallowing: A few with stimulation  Type of Nipple: Everted at rest and after stimulation  Comfort (Breast/Nipple): Soft / non-tender  Hold (Positioning): Assistance needed to correctly position infant at breast and maintain latch.  LATCH Score: 8    Interventions Interventions: Breast feeding basics reviewed;Assisted with latch;Skin to skin;Education  Consult Status Consult Status: Follow-up from L&D    Vivianne Master [facility] 10/20/2021, 10:07 AM

## 2021-10-20 NOTE — Discharge Summary (Signed)
Postpartum Discharge Summary  Date of Service updated 10/22/21     Patient Name: Sherry Colon DOB: 03/12/1996 MRN: [id number]  Date of admission: 10/20/2021 Delivery date:10/20/2021  Delivering provider: Julianne Handler  Date of discharge: 10/22/2021  Admitting diagnosis: Uterine contractions [O47.9] Intrauterine pregnancy: [redacted]w[redacted]d    Secondary diagnosis:  Principal Problem:   Uterine contractions Active Problems:   Asthma affecting pregnancy in second trimester   Fibromyalgia   Chlamydia infection affecting pregnancy in third trimester, antepartum   Positive GBS test   SVD (spontaneous vaginal delivery)  Additional problems: trichomonas, anti-N antibody screen, hx seizures with overheating   Discharge diagnosis: Term Pregnancy Delivered                                              Post partum procedures: none Augmentation: N/A Complications: None  Hospital course: Onset of Labor With Vaginal Delivery      26y.o. yo G2P1001 at 348w3das admitted in Active Labor on 10/20/2021. Patient had an uncomplicated labor course as follows:  Membrane Rupture Time/Date: 9:24 AM ,10/20/2021   Delivery Method:Vaginal, Spontaneous  Episiotomy: Left Mediolateral  Lacerations:  1st degree  Patient had an uncomplicated postpartum course.  She is ambulating, tolerating a regular diet, passing flatus, and urinating well. Patient is discharged home in stable condition on 10/22/21.  Newborn Data: Birth date:10/20/2021  Birth time:9:24 AM  Gender:Female  Living status:Living  Apgars:8 ,9  Weight:3090 g   Magnesium Sulfate received: No BMZ received: No Rhophylac:N/A MMR:N/A T-DaP:Given prenatally Flu: Yes Transfusion:No  Physical exam  Vitals:   10/21/21 0505 10/21/21 1446 10/21/21 2050 10/22/21 0625  BP: 102/66 123/70 107/72 110/63  Pulse: 80 90 80 71  Resp: 16 17 16 16   Temp: 98.2 F (36.8 C) 98 F (36.7 C) 98.1 F (36.7 C) 97.9 F (36.6 C)  TempSrc: Oral Oral Oral Oral   SpO2:  100% 99% 100%   General: alert, cooperative, and no distress Lochia: appropriate Uterine Fundus: firm Incision: N/A DVT Evaluation: No evidence of DVT seen on physical exam. Labs: Lab Results  Component Value Date   WBC 8.0 10/20/2021   HGB 11.8 (L) 10/20/2021   HCT 37.1 10/20/2021   MCV 91.6 10/20/2021   PLT 253 10/20/2021   CMP Latest Ref Rng & Units 10/20/2021  Glucose 70 - 99 mg/dL 99  BUN 6 - 20 mg/dL 10  Creatinine 0.44 - 1.00 mg/dL 0.67  Sodium 135 - 145 mmol/L 135  Potassium 3.5 - 5.1 mmol/L 3.6  Chloride 98 - 111 mmol/L 107  CO2 22 - 32 mmol/L 18(L)  Calcium 8.9 - 10.3 mg/dL 8.7(L)  Total Protein 6.5 - 8.1 g/dL 6.2(L)  Total Bilirubin 0.3 - 1.2 mg/dL 0.3  Alkaline Phos 38 - 126 U/L 124  AST 15 - 41 U/L 17  ALT 0 - 44 U/L 10   Edinburgh Score: Edinburgh Postnatal Depression Scale Screening Tool 10/20/2021  I have been able to laugh and see the funny side of things. 0  I have looked forward with enjoyment to things. 0  I have blamed myself unnecessarily when things went wrong. 0  I have been anxious or worried for no good reason. 0  I have felt scared or panicky for no good reason. 0  Things have been getting on top of me. 2  I have been so  unhappy that I have had difficulty sleeping. 0  I have felt sad or miserable. 0  I have been so unhappy that I have been crying. 0  The thought of harming myself has occurred to me. 0  Edinburgh Postnatal Depression Scale Total 2     After visit meds:  Allergies as of 10/22/2021       Reactions   Cinnamon    Facial and throat swelling   Montelukast Hives   upper respiratory infection   Orange Oil    Facial and throat swelling   Loratadine         Medication List     TAKE these medications    acetaminophen 325 MG tablet Commonly known as: Tylenol Take 2 tablets (650 mg total) by mouth every 4 (four) hours as needed (for pain scale < 4).   albuterol 108 (90 Base) MCG/ACT inhaler Commonly known as:  VENTOLIN HFA Inhale 2 puffs into the lungs every 6 (six) hours as needed for wheezing or shortness of breath.   Blood Pressure Kit Devi 1 kit by Does not apply route once a week.   cetirizine 10 MG tablet Commonly known as: ZyrTEC Allergy Take 1 tablet (10 mg total) by mouth daily.   Gojji Weight Scale Misc 1 Device by Does not apply route every 30 (thirty) days.   ibuprofen 600 MG tablet Commonly known as: ADVIL Take 1 tablet (600 mg total) by mouth every 6 (six) hours.   PrePLUS 27-1 MG Tabs Take 1 tablet by mouth daily.   Unisom SleepTabs 25 MG tablet Generic drug: doxylamine (Sleep) Take 1 tablet (25 mg total) by mouth at bedtime.        Discharge home in stable condition Infant Feeding: Bottle and Breast Infant Disposition:home with mother Discharge instruction: per After Visit Summary and Postpartum booklet. Activity: Advance as tolerated. Pelvic rest for 6 weeks.  Diet: routine diet Future Appointments: Future Appointments  Date Time Provider [place]  12/07/2021 10:55 AM Gabriel Carina, CNM [facility] None   Follow up Visit:  Follow-up Information     [facility] Follow up.   Why: As scheduled, In 4-6 weeks for postpartum visit Contact information: [address] [place] [address] [phone]                 Please schedule this patient for a In person postpartum visit in 6 weeks with the following provider: Any provider. Additional Postpartum F/U: none   Low risk pregnancy complicated by:  none Delivery mode:  Vaginal, Spontaneous  Anticipated Birth Control:  Depo in office   10/22/2021 Fatima Blank, CNM

## 2021-10-20 NOTE — MAU Note (Signed)
...  Sherry Colon is a 26 y.o. at [redacted]w[redacted]d here in MAU reporting: CTX since 0500 this morning. Lost her mucous plug. Mebranes stripped in the office yesterday. Was 4cm in office. +FM. No VB or LOF.   Pain score: 9/10 lower abdomen FHT: 140 initial Lab orders placed from triage: MAU Labor Eval

## 2021-10-20 NOTE — Lactation Note (Signed)
This note was copied from a baby's chart. Lactation Consultation Note  Patient Name: Sherry Colon [id number] Date: 10/20/2021 Reason for consult: Initial assessment;Term;Primapara;1st time breastfeeding Age:25 hours   P1 mother whose infant is now 59 hours old.  This is a term baby at 39+3 weeks.  Mother's current feeding preference is breast/formula.  Baby was STS on mother's chest and asleep when I arrived.  Mother stated that she latched after delivery but is sleepy now.  Reviewed breast feeding basics with mother.  She has observed colostrum drops.  Demonstrated finger feeding drops to baby (mother unsure of how to spell baby's name and will ask father when he returns).  Encouraged feeding on cue or at least 8-12 times/24 hours.  Suggested she call her RN/LC for latch assistance as needed.  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  She is trying to contact them now.  Mother had an unopened bottle of formula at bedside.  Suggested she refrain from using formula at this time and practice latching and feeding EBM to baby.  Discussed baby's tummy size and reassurance provided.  Mother will call as needed.  Father not present at this time.   Maternal Data Has patient been taught Hand Expression?: Yes Does the patient have breastfeeding experience prior to this delivery?: No  Feeding Mother's Current Feeding Choice: Breast Milk and Formula  LATCH Score Latch: Grasps breast easily, tongue down, lips flanged, rhythmical sucking.  Audible Swallowing: A few with stimulation  Type of Nipple: Everted at rest and after stimulation  Comfort (Breast/Nipple): Soft / non-tender  Hold (Positioning): Assistance needed to correctly position infant at breast and maintain latch.  LATCH Score: 8   Lactation Tools Discussed/Used    Interventions Interventions: Breast feeding basics reviewed;Education  Discharge Pump: Personal (Does not have a  DEBP, however, desires formula from [facility]) WIC Program: Yes  Consult Status Consult Status: Follow-up Date: 10/21/21 Follow-up type: In-patient    Dora Sims 10/20/2021, 12:48 PM

## 2021-10-20 NOTE — H&P (Signed)
OBSTETRIC ADMISSION HISTORY AND PHYSICAL  Sherry Colon is a 26 y.o. female G2P1001 with IUP at 80w3dpresenting for labor. She reports +FMs. No LOF, VB, blurry vision, headaches, peripheral edema, or RUQ pain. She plans on breast and formula feeding. She requests Depo Provera for birth control.  Dating: By 9w [place]--->  Estimated Date of Delivery: 10/24/21  Sono:    _0 , normal anatomy, 302g, 73%ile   Prenatal History/Complications: -Chlamydia x2, treated, needs TOC -Trichomonas, treated, neg TOC -GBS carrier  Past Medical History: Past Medical History:  Diagnosis Date   Asthma    Fibromyalgia    Headache    [place]    last seziure when she was in middle school    Past Surgical History: Past Surgical History:  Procedure Laterality Date   TONSILLECTOMY      Obstetrical History: OB History     Gravida  2   Para  1   Term  1   Preterm      AB      Living  1      SAB      IAB      Ectopic      Multiple  0   Live Births  1           Social History: Social History   Socioeconomic History   Marital status: Single    Spouse name: Not on file   Number of children: Not on file   Years of education: Not on file   Highest education level: Not on file  Occupational History   Not on file  Tobacco Use   Smoking status: Never   Smokeless tobacco: Never  Vaping Use   Vaping Use: Never used  Substance and Sexual Activity   Alcohol use: Never   Drug use: Never   Sexual activity: Yes  Other Topics Concern   Not on file  Social History Narrative   Not on file   Social Determinants of Health   Financial Resource Strain: Not on file  Food Insecurity: Not on file  Transportation Needs: Not on file  Physical Activity: Not on file  Stress: Not on file  Social Connections: Not on file    Family History: Family History  Problem Relation Age of Onset   Heart disease Mother    Diabetes Mother    Other Mother      Allergies: Allergies  Allergen Reactions   Cinnamon     Facial and throat swelling   Montelukast Hives    upper respiratory infection   Orange Oil     Facial and throat swelling   Loratadine     Medications Prior to Admission  Medication Sig Dispense Refill Last Dose   Prenatal Vit-Fe Fumarate-FA (PREPLUS) 27-1 MG TABS Take 1 tablet by mouth daily. 30 tablet 13 10/19/2021   albuterol (VENTOLIN HFA) 108 (90 Base) MCG/ACT inhaler Inhale 2 puffs into the lungs every 6 (six) hours as needed for wheezing or shortness of breath. 1 each 3    Blood Pressure Monitoring (BLOOD PRESSURE KIT) DEVI 1 kit by Does not apply route once a week. (Patient not taking: Reported on 09/29/2021) 1 each 0    cetirizine (ZYRTEC ALLERGY) 10 MG tablet Take 1 tablet (10 mg total) by mouth daily. (Patient not taking: Reported on 09/29/2021) 60 tablet 2    doxylamine, Sleep, (UNISOM SLEEPTABS) 25 MG tablet Take 1 tablet (25 mg total) by mouth at bedtime. (Patient not taking: Reported on  09/29/2021) 30 tablet 2    Misc. Devices (GOJJI WEIGHT SCALE) MISC 1 Device by Does not apply route every 30 (thirty) days. (Patient not taking: Reported on 09/29/2021) 1 each 0    promethazine (PHENERGAN) 25 MG tablet Take 1 tablet (25 mg total) by mouth every 6 (six) hours as needed for nausea or vomiting. (Patient not taking: Reported on 09/29/2021) 30 tablet 2      Review of Systems:  All systems reviewed and negative except as stated in HPI  PE: Blood pressure (!) 142/96, pulse 84, temperature 98.3 F (36.8 C), temperature source Oral, resp. rate 20, last menstrual period 12/12/2020, SpO2 100 %, currently breastfeeding. General appearance: alert, cooperative, and severe distress Lungs: regular rate and effort Heart: regular rate  Abdomen: soft, non-tender Extremities: Homans sign is negative, no sign of DVT EFM: 130 bpm, mod variability, + accels, no decels Toco: 2-3 Dilation: 10 Effacement (%): 100 Station: Plus 2 Exam  by:: Andres Ege, RN  Prenatal labs: ABO, Rh: --/--/B POS (02/23 0830) Antibody: NEG (02/23 0830) Rubella: 2.23 (09/07 1130) RPR: Non Reactive (12/08 0936)  HBsAg: Negative (09/07 1130)  HIV: Non Reactive (12/08 0936)  GBS: Positive/-- (02/02 1543)  2 hr GTT nml  Prenatal Transfer Tool  Maternal Diabetes: No Genetic Screening: Normal Maternal Ultrasounds/Referrals: Normal Fetal Ultrasounds or other Referrals:  None Maternal Substance Abuse:  No Significant Maternal Medications:  None Significant Maternal Lab Results: Group B Strep positive  Results for orders placed or performed during the hospital encounter of 10/20/21 (from the past 24 hour(s))  Protein / creatinine ratio, urine   Collection Time: 10/20/21  8:15 AM  Result Value Ref Range   Creatinine, Urine 90.20 mg/dL   Total Protein, Urine 27 mg/dL   Protein Creatinine Ratio 0.30 (H) 0.00 - 0.15 mg/mg[Cre]  Resp Panel by RT-PCR (Flu A&B, Covid) Nasopharyngeal Swab   Collection Time: 10/20/21  8:29 AM   Specimen: Nasopharyngeal Swab; Nasopharyngeal(NP) swabs in vial transport medium  Result Value Ref Range   SARS Coronavirus 2 by RT PCR NEGATIVE NEGATIVE   Influenza A by PCR NEGATIVE NEGATIVE   Influenza B by PCR NEGATIVE NEGATIVE  CBC   Collection Time: 10/20/21  8:29 AM  Result Value Ref Range   WBC 8.0 4.0 - 10.5 K/uL   RBC 4.05 3.87 - 5.11 MIL/uL   Hemoglobin 11.8 (L) 12.0 - 15.0 g/dL   HCT 37.1 36.0 - 46.0 %   MCV 91.6 80.0 - 100.0 fL   MCH 29.1 26.0 - 34.0 pg   MCHC 31.8 30.0 - 36.0 g/dL   RDW 14.7 11.5 - 15.5 %   Platelets 253 150 - 400 K/uL   nRBC 0.0 0.0 - 0.2 %  Comprehensive metabolic panel   Collection Time: 10/20/21  8:29 AM  Result Value Ref Range   Sodium 135 135 - 145 mmol/L   Potassium 3.6 3.5 - 5.1 mmol/L   Chloride 107 98 - 111 mmol/L   CO2 18 (L) 22 - 32 mmol/L   Glucose, Bld 99 70 - 99 mg/dL   BUN 10 6 - 20 mg/dL   Creatinine, Ser 0.67 0.44 - 1.00 mg/dL   Calcium 8.7 (L) 8.9 - 10.3  mg/dL   Total Protein 6.2 (L) 6.5 - 8.1 g/dL   Albumin 2.8 (L) 3.5 - 5.0 g/dL   AST 17 15 - 41 U/L   ALT 10 0 - 44 U/L   Alkaline Phosphatase 124 38 - 126 U/L   Total  Bilirubin 0.3 0.3 - 1.2 mg/dL   GFR, Estimated >60 >60 mL/min   Anion gap 10 5 - 15  Type and screen [place]   Collection Time: 10/20/21  8:30 AM  Result Value Ref Range   ABO/RH(D) B POS    Antibody Screen NEG    Sample Expiration      10/23/2021,2359 Performed at [facility], [place] [address] [place], [place] [address]     Patient Active Problem List   Diagnosis Date Noted   Uterine contractions 10/20/2021   SVD (spontaneous vaginal delivery) 10/20/2021   Positive GBS test 10/01/2021   Trichomonal vaginitis during pregnancy in second trimester 07/14/2021   Chlamydia infection affecting pregnancy in third trimester, antepartum 05/12/2021   Abnormal finding on antenatal screen 05/12/2021   Asthma affecting pregnancy in second trimester 05/04/2021   Seizures ([place]) 05/04/2021   Fibromyalgia 05/04/2021   Supervision of other normal pregnancy, antepartum 03/23/2021    Assessment: Luvada Salamone is a 26 y.o. G2P1001 at 3w3dhere for labor  1. Labor: active 2. FWB: Cat I 3. Pain: per request 4. GBS: pos   Plan: Admit to LD Ampicillin Anticipate precipitous SVD  MJulianne Handler CNM  10/20/2021, 9:52 AM

## 2021-10-21 LAB — GC/CHLAMYDIA PROBE AMP (~~LOC~~) NOT AT ARMC
Chlamydia: NEGATIVE
Comment: NEGATIVE
Comment: NORMAL
Neisseria Gonorrhea: NEGATIVE

## 2021-10-21 NOTE — Progress Notes (Signed)
Post Partum Day 1 Subjective: Sherry Colon is a 26 y.o. [id number] [redacted]w[redacted]d s/p SVD.  No acute events overnight.  Pt denies problems with ambulating, voiding or po intake.  She denies nausea or vomiting.  Pain is well controlled. No HA, or BV, RUQ. She has had flatus.  Lochia Minimal.  Plan for birth control is Depo-Provera, request to wait until 6 week PP visit.  Method of Feeding: breast and bottle with formula.   Objective: Pt up and ambulating in room.  Partner in bed with baby. Blood pressure 102/66, pulse 80, temperature 98.2 F (36.8 C), temperature source Oral, resp. rate 16, last menstrual period 12/12/2020, SpO2 99 %, unknown if currently breastfeeding.  Physical Exam:  General: alert, cooperative and no distress Lochia:normal flow Chest: normal WOB, CTAB Heart: Regular rate Abdomen: +BS, soft,  Uterine Fundus: firm, at U DVT Evaluation: No evidence of DVT seen on physical exam. Extremities: No edema  Recent Labs    10/20/21 0829  HGB 11.8*  HCT 37.1    Assessment/Plan: ASSESSMENT: Sherry Colon is a 26 y.o. [id number] [redacted]w[redacted]d s/p SVD.  Continue routine PP care and breastfeeding support PRN. #Elevated BP: On admission while laboring and 1 hour PP.  UPCR: 0.30.  By definition mild preE.  Postpartum BP's: normotensive. Discussed with patient need for monitoring for postpartum preE, she needs 1 week PP visit in office for BP check.  Education provided on preE signs and symptoms.  #CT: tx'd on 10/11/2021: TOC pending, did not discussed that results will be positive d/t early testing-partner in the room.  Will repeat at 6 week postpartum visit.  #Contraception: outpatient Depo-Provera.  Offered inpatient and discussed with patient this has no impact on breast milk production. Still declined.  Encouraged patient use condoms and withdrawal to prevent pregnancy if plan she plans to have intercourse before 6 week visit. Once given depo-provera, she will need BUM x 7 days.   Plan for  discharge tomorrow   LOS: 1 day   [facility] 10/21/2021, 7:27 AM

## 2021-10-21 NOTE — Lactation Note (Signed)
This note was copied from a baby's chart. Lactation Consultation Note  Patient Name: Sherry Colon [id number] Date: 10/21/2021 Reason for consult: Follow-up assessment;Primapara;1st time breastfeeding;Term Age:25 hours   P1 mother whose infant is now 25 hours old.  This is a term baby at 39+3 weeks.  Mother's feeding preference has changed.  She now desires to pump and bottle feed her EBM.  Mother is using formula until her milk supply comes to volume.  Baby "Sherry Colon" was asleep in father's arms when I arrived.  She last formula fed an hour ago.  Reviewed feeding plan and encouraged formula feeding amounts of 15-30 mls once she reaches 24 hours of life.  Parents have no difficulty bottle feeding using the yellow slow flow nipple.  Offered to initiate the DEBP; mother receptive.  Education completed and reviewed pump parts, assembly and cleaning.  #24 flange is appropriate at this time, however, I anticipate mother may need a #27 in the near future.  She is aware of how to correctly size flanges.  Asked parents to always provide mother's EBM prior to giving any formula supplementation.  Mother will pump every three hours for 15 minutes.  Mother does not have a DEBP for home use.  Per our discussion yesterday, mother desires to obtain formula from [facility].  She is aware that she cannot have a DEBP and formula.  She does not have private insurance.  Discussed options for obtaining a DEBP.  Father stated that he can buy a pump; reviewed the importance of getting a high quality pump and discussed average prices.  Mother plans to contact the [facility] representative this morning to finalize her plans for feeding options.  RN updated.   Maternal Data Has patient been taught Hand Expression?: Yes Does the patient have breastfeeding experience prior to this delivery?: No  Feeding Mother's Current Feeding Choice: Breast Milk and Formula  LATCH Score                    Lactation Tools  Discussed/Used Tools: Pump;Flanges Flange Size: 24;27 Breast pump type: Double-Electric Breast Pump;Manual Pump Education: Setup, frequency, and cleaning;Milk Storage Reason for Pumping: Mother has decided to pump and bottle feed only Pumping frequency: Every three hours  Interventions Interventions: Education  Discharge Pump: DEBP;Manual WIC Program: Yes  Consult Status Consult Status: Follow-up Date: 10/22/21 Follow-up type: In-patient    Maryama Kuriakose R Lakisa Lotz 10/21/2021, 8:30 AM

## 2021-10-22 MED ORDER — IBUPROFEN 600 MG PO TABS: 600.0000 mg | ORAL_TABLET | Freq: Four times a day (QID) | ORAL | 0 refills | Status: DC

## 2021-10-22 MED ORDER — ACETAMINOPHEN 325 MG PO TABS: 650.0000 mg | ORAL_TABLET | ORAL | 0 refills | Status: DC | PRN

## 2021-10-22 NOTE — Plan of Care (Signed)
  Problem: Education: Goal: Knowledge of condition will improve Outcome: Progressing   Problem: Activity: Goal: Will verbalize the importance of balancing activity with adequate rest periods Outcome: Progressing Goal: Ability to tolerate increased activity will improve Outcome: Progressing   Problem: Coping: Goal: Ability to identify and utilize available resources and services will improve Outcome: Progressing   Problem: Life Cycle: Goal: Chance of risk for complications during the postpartum period will decrease Outcome: Progressing   Problem: Role Relationship: Goal: Ability to demonstrate positive interaction with newborn will improve Outcome: Progressing   

## 2021-10-22 NOTE — Discharge Instructions (Signed)

## 2021-10-22 NOTE — Lactation Note (Signed)
This note was copied from a baby's chart. Lactation Consultation Note  Patient Name: Sherry Colon JOACZ'Y Date: 10/22/2021 Reason for consult: Follow-up assessment;Primapara;1st time breastfeeding;Term Age:25 hours  LC in to visit with P2 Mom of term baby on day of discharge.  Baby at 4% weight loss and has been primarily formula feeding by bottle per Mom's choice.  Mom states baby will latch to the breast, but she was wanting to pump and bottle feed.  Baby just had a bottle and was showing subtle cues.  Offered to help with latching to the breast.  Mom stated she wanted to pump.   Mom doesn't have a DEBP at home.  Mom has WIC.  Offered her a [facility] loaner explaining the deposit needed.  Mom has an appt on Monday with WIC.  Encouraged STS with baby and offering the breast with cues.  Recommended double pumping if baby is supplemented with formula.  Engorgement prevention and treatment reviewed.  Mom aware of OP lactation support.   RN to let [facility] know if Mom would like a loaner pump on discharge.  Discharge Discharge Education: Engorgement and breast care Pump: Manual (Offered Mom a [facility] loaner)  Consult Status Consult Status: Complete Date: 10/22/21 Follow-up type: Call as needed    Judee Clara 10/22/2021, 1:36 PM

## 2021-10-26 ENCOUNTER — Encounter: Payer: Medicare Other | Admitting: Obstetrics and Gynecology

## 2021-11-01 ENCOUNTER — Telehealth (HOSPITAL_COMMUNITY): Payer: Self-pay

## 2021-11-01 NOTE — Telephone Encounter (Signed)
"  I'm doing good." Patient declines questions or concerns about her healing. ? ?"She's doing good. She had an appointment yesterday and she gained weight. She sleeps in a bassinet."  RN reviewed ABC's of safe sleep with patient. Patient declines any questions or concerns about baby. ? ?EPDS score is 0. ? ?Marcelino Duster Airyonna Franklyn,RN3,MSN,RNC-MNN ?11/01/2021,1604 ?

## 2021-12-07 ENCOUNTER — Ambulatory Visit (INDEPENDENT_AMBULATORY_CARE_PROVIDER_SITE_OTHER): Payer: Medicaid Other | Admitting: Obstetrics and Gynecology

## 2021-12-07 ENCOUNTER — Encounter: Payer: Self-pay | Admitting: Obstetrics and Gynecology

## 2021-12-07 ENCOUNTER — Ambulatory Visit: Payer: Medicare Other | Admitting: Certified Nurse Midwife

## 2021-12-07 NOTE — Progress Notes (Signed)
? ? ?  Post Partum Visit Note ? ?Sherry Colon is a 26 y.o. G52P2002 female who presents for a postpartum visit. She is 6 week postpartum following a normal spontaneous vaginal delivery.  I have fully reviewed the prenatal and intrapartum course. The delivery was at 39w,3d gestational weeks.  Anesthesia: none. Postpartum course has been unremarkable. Baby is doing well. Baby is feeding by both breast and bottle - Sherry Colon . Bleeding no bleeding. Bowel function is normal. Bladder function is normal. Patient is sexually active. Contraception method is Depo-Provera injections. Postpartum depression screening: negative. ? ? ?The pregnancy intention screening data noted above was reviewed. Potential methods of contraception were discussed. The patient elected to proceed with No data recorded. ? ? ? ?Health Maintenance Due  ?Topic Date Due  ? COVID-19 Vaccine (1) Never done  ? HPV VACCINES (1 - 2-dose series) Never done  ? ? ?The following portions of the patient's history were reviewed and updated as appropriate: allergies, current medications, past family history, past medical history, past social history, past surgical history, and problem list. ? ?Review of Systems ?Pertinent items noted in HPI and remainder of comprehensive ROS otherwise negative. ? ?Objective:  ?There were no vitals taken for this visit.  ? ?General:  alert  ? Breasts:  not indicated  ?Lungs: clear to auscultation bilaterally  ?Heart:  regular rate and rhythm, S1, S2 normal, no murmur, click, rub or gallop  ?Abdomen: soft, non-tender; bowel sounds normal; no masses,  no organomegaly   ?Wound NA  ?GU exam:  normal  ?     ?Assessment:  ? ? There are no diagnoses linked to this encounter. ? ?Nl postpartum exam.  ? ?Plan:  ? ?Essential components of care per ACOG recommendations: ? ?1.  Mood and well being: Patient with negative depression screening today. Reviewed local resources for support.  ?- Patient tobacco use? No.   ?- hx of drug use? No.    ? ?2. Infant care and feeding:  ?-Patient currently breastmilk feeding? No.  ?-Social determinants of health (SDOH) reviewed in EPIC. ? ?3. Sexuality, contraception and birth spacing ?- Patient does not know want a pregnancy in the next year.  Desired family size is uncertain  ?- Reviewed reproductive life planning. Reviewed contraceptive methods based on pt preferences and effectiveness.  Patient desired Female Condom today.   ?- Discussed birth spacing of 18 months ? ?4. Sleep and fatigue ?-Encouraged family/partner/community support of 4 hrs of uninterrupted sleep to help with mood and fatigue ? ?5. Physical Recovery  ?- Discussed patients delivery and complications. She describes her labor as good. ?- Patient had a Vaginal, no problems at delivery. Patient had a 1st degree laceration. Perineal healing reviewed. Patient expressed understanding ?- Patient has urinary incontinence? No. ?- Patient is safe to resume physical and sexual activity ? ?6.  Health Maintenance ?- HM due items addressed Yes ?- Last pap smear  ?Diagnosis  ?Date Value Ref Range Status  ?05/04/2021   Final  ? - Negative for Intraepithelial Lesions or Malignancy (NILM)  ?05/04/2021 - Benign reactive/reparative changes  Final  ? Pap smear not done at today's visit.  ?-Breast Cancer screening indicated? No.  ? ?7. Chronic Disease/Pregnancy Condition follow up: None ? ?- PCP follow up ? ?To reframe from IC x 2 weeks and then return to start Depo Provera ? ?Nettie Elm, MD ?Center for [facility], [facility]  ?

## 2021-12-07 NOTE — Patient Instructions (Signed)

## 2021-12-21 ENCOUNTER — Ambulatory Visit: Payer: Medicare Other

## 2022-01-02 ENCOUNTER — Ambulatory Visit (INDEPENDENT_AMBULATORY_CARE_PROVIDER_SITE_OTHER): Payer: Medicare Other

## 2022-01-02 VITALS — BP 104/72 | HR 78 | Ht 62.0 in | Wt 136.0 lb

## 2022-01-02 DIAGNOSIS — Z3042 Encounter for surveillance of injectable contraceptive: Secondary | ICD-10-CM | POA: Diagnosis not present

## 2022-01-02 LAB — POCT URINE PREGNANCY: Preg Test, Ur: NEGATIVE

## 2022-01-02 MED ORDER — MEDROXYPROGESTERONE ACETATE 150 MG/ML IM SUSP
150.0000 mg | Freq: Once | INTRAMUSCULAR | Status: AC
  Administered 2022-01-02: 150 mg via INTRAMUSCULAR

## 2022-01-02 MED ORDER — MEDROXYPROGESTERONE ACETATE 150 MG/ML IM SUSP: 150.0000 mg | INTRAMUSCULAR | 4 refills | Status: AC

## 2022-01-02 NOTE — Progress Notes (Signed)
Agree with nurses's documentation of this patient's clinic encounter.  Chord Takahashi L, MD  

## 2022-01-02 NOTE — Progress Notes (Signed)
SUBJECTIVE: Sherry Colon is a 26 y.o. female who presents for 2nd. UPT/DEPO Injection.  Last unprotected sex was the middle of April. ? ?OBJECTIVE: Appears well, in no apparent distress.  Vital signs are normal. Urine dipstick shows not done.   ? ?ASSESSMENT: 2nd UPT to start DEPO Injection. ?UPT today is NEGATIVE.   ? ?Last PAP 05/04/2021 ? ?PLAN: Office stock DEPO Injection given in [place], tolerated well. ? ?Next DEPO due July 24-April 03, 2022 ? ?Administrations This Visit   ? ? medroxyPROGESTERone (DEPO-PROVERA) injection 150 mg   ? ? Admin Date ?01/02/2022 Action ?Given Dose ?150 mg Route ?Intramuscular Administered By ?Tamela Oddi, RMA  ? ?  ?  ? ?  ?  ? ?

## 2022-02-27 ENCOUNTER — Ambulatory Visit: Payer: Medicare Other

## 2022-03-31 ENCOUNTER — Ambulatory Visit: Payer: Medicare Other

## 2022-04-12 ENCOUNTER — Ambulatory Visit (INDEPENDENT_AMBULATORY_CARE_PROVIDER_SITE_OTHER): Payer: Medicare Other

## 2022-04-12 ENCOUNTER — Other Ambulatory Visit (HOSPITAL_COMMUNITY)
Admission: RE | Admit: 2022-04-12 | Discharge: 2022-04-12 | Disposition: A | Discharge status: Home / Self Care | Payer: Medicare Other | Source: Ambulatory Visit | Attending: Obstetrics and Gynecology | Admitting: Obstetrics and Gynecology

## 2022-04-12 VITALS — BP 117/72 | HR 69 | Ht 62.0 in | Wt 135.0 lb

## 2022-04-12 DIAGNOSIS — N898 Other specified noninflammatory disorders of vagina: Secondary | ICD-10-CM | POA: Diagnosis present

## 2022-04-12 NOTE — Progress Notes (Signed)
SUBJECTIVE:  26 y.o. female complains of clear vaginal discharge for 1 week.  Denies abnormal vaginal bleeding or significant pelvic pain or fever. No UTI symptoms. Denies history of known exposure to STD.  Patient's last menstrual period was 03/06/2022.  OBJECTIVE:  She appears well, afebrile. Urine dipstick: not done.  ASSESSMENT:  Vaginal Discharge  Vaginal Odor   PLAN:  GC, chlamydia, trichomonas, BVAG, CVAG probe sent to lab. Treatment: To be determined once lab results are received ROV prn if symptoms persist or worsen.

## 2022-04-12 NOTE — Progress Notes (Signed)
Agree with nurses's documentation of this patient's clinic encounter.  Jamael Hoffmann L, MD  

## 2022-04-13 LAB — CERVICOVAGINAL ANCILLARY ONLY
Bacterial Vaginitis (gardnerella): POSITIVE — AB
Candida Glabrata: NEGATIVE
Candida Vaginitis: NEGATIVE
Chlamydia: POSITIVE — AB
Comment: NEGATIVE
Comment: NEGATIVE
Comment: NEGATIVE
Comment: NEGATIVE
Comment: NEGATIVE
Comment: NORMAL
Neisseria Gonorrhea: NEGATIVE
Trichomonas: NEGATIVE

## 2022-04-14 ENCOUNTER — Other Ambulatory Visit: Payer: Self-pay | Admitting: *Deleted

## 2022-04-14 DIAGNOSIS — A749 Chlamydial infection, unspecified: Secondary | ICD-10-CM

## 2022-04-14 DIAGNOSIS — B9689 Other specified bacterial agents as the cause of diseases classified elsewhere: Secondary | ICD-10-CM

## 2022-04-14 MED ORDER — DOXYCYCLINE HYCLATE 100 MG PO CAPS: 100.0000 mg | ORAL_CAPSULE | Freq: Two times a day (BID) | ORAL | 0 refills | Status: AC

## 2022-04-14 MED ORDER — METRONIDAZOLE 500 MG PO TABS: 500.0000 mg | ORAL_TABLET | Freq: Two times a day (BID) | ORAL | 0 refills | Status: DC

## 2022-04-14 NOTE — Progress Notes (Signed)
TC to notify pt. RX sent for chlamydia and BV. Report sent to [facility]. Pt verbalized understanding and will call to make 3-4 week f/u appt on Monday.

## 2022-05-30 ENCOUNTER — Ambulatory Visit: Payer: Medicare Other

## 2022-05-31 ENCOUNTER — Ambulatory Visit: Payer: Medicare Other

## 2022-06-19 ENCOUNTER — Ambulatory Visit: Payer: Medicare Other

## 2022-08-02 DIAGNOSIS — L02212 Cutaneous abscess of back [any part, except buttock]: Secondary | ICD-10-CM | POA: Diagnosis not present

## 2022-08-15 ENCOUNTER — Ambulatory Visit: Payer: Medicare Other

## 2022-08-16 ENCOUNTER — Ambulatory Visit (INDEPENDENT_AMBULATORY_CARE_PROVIDER_SITE_OTHER): Payer: Medicare Other | Admitting: *Deleted

## 2022-08-16 VITALS — BP 110/68 | HR 65

## 2022-08-16 DIAGNOSIS — Z3202 Encounter for pregnancy test, result negative: Secondary | ICD-10-CM | POA: Diagnosis not present

## 2022-08-16 LAB — POCT URINE PREGNANCY: Preg Test, Ur: NEGATIVE

## 2022-08-16 NOTE — Progress Notes (Signed)
Sherry Colon presents for 1st UPT for Depo restart. Last dose in May. She reports she is not sexually active right now. UPT negative today. Pt instructed no unprotected sex for next 2 weeks until 08/30/22 appt for 2nd UPT and restart. Pt verbalized understanding.

## 2022-08-27 IMAGING — US US MFM OB COMP +14 WKS
2 series · 15 of 28 positions shown · non-contrast
Comparison: none

[Series 1: us mfm ob comp +14 wks · 14 of 82 slices shown (1 of 2)]
[im 1/82]
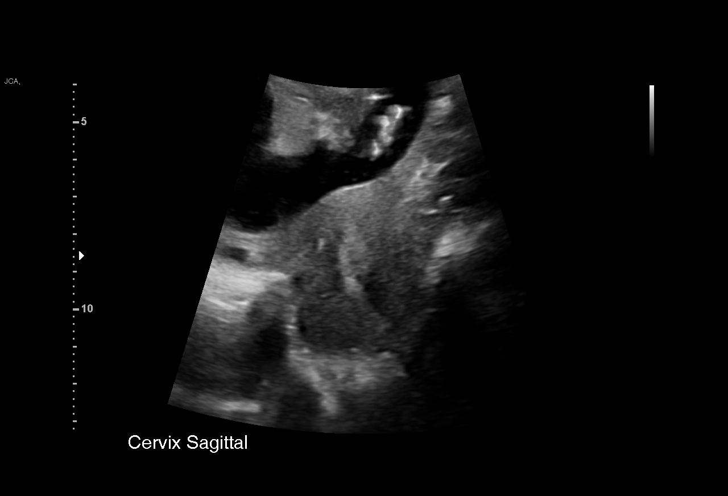
[im 7/82]
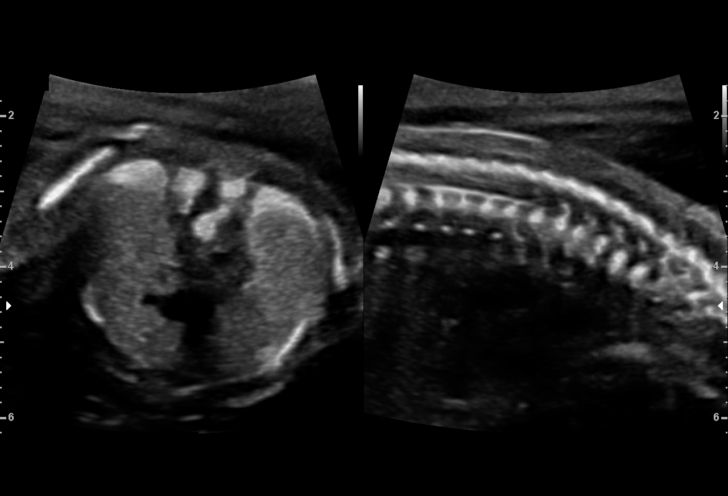
[im 13/82]
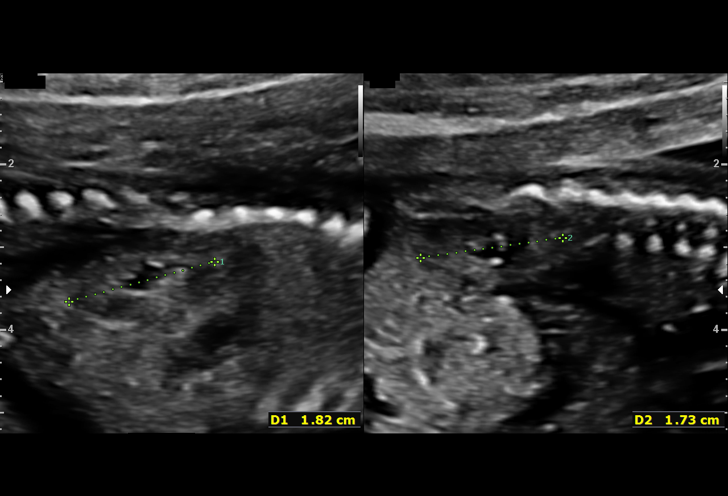
[im 20/82]
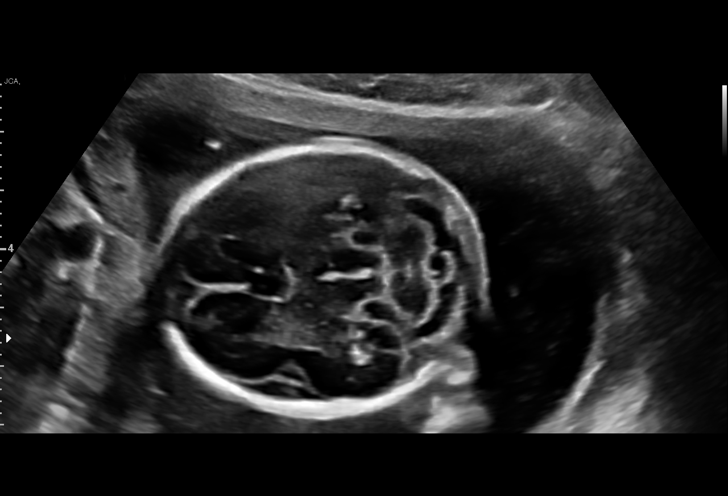
[im 26/82]
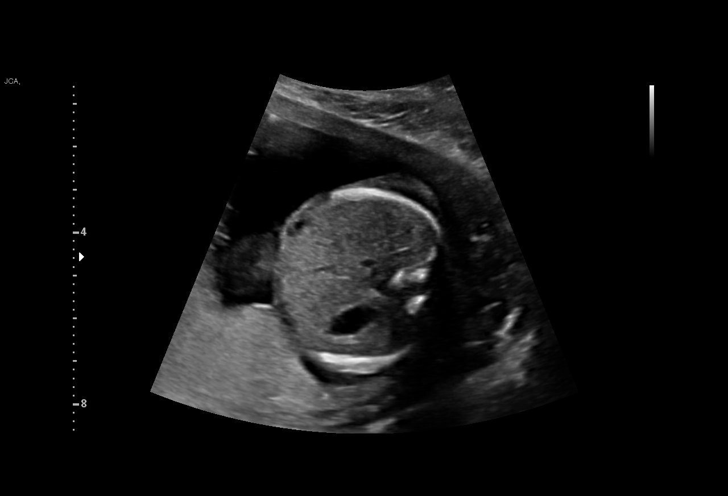
[im 33/82]
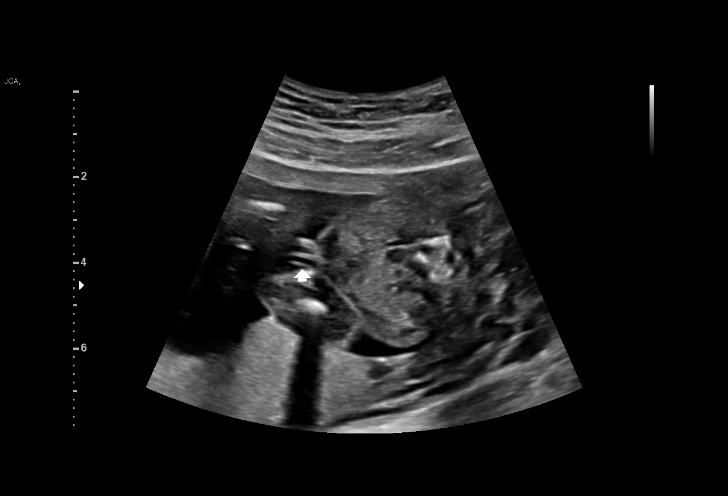
[im 39/82]
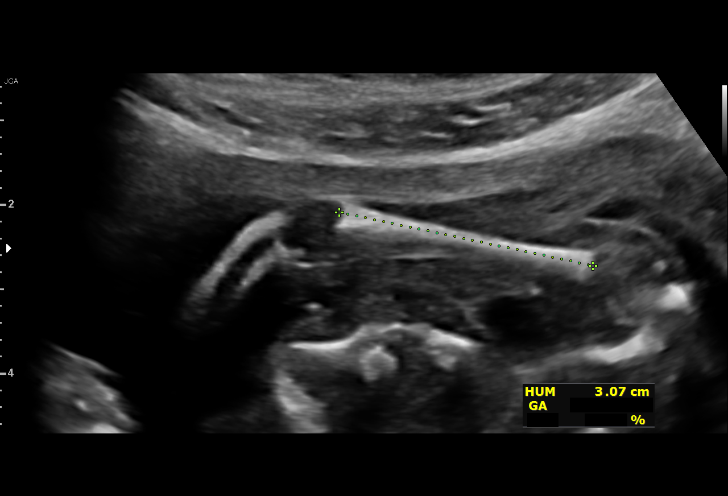
[im 46/82]
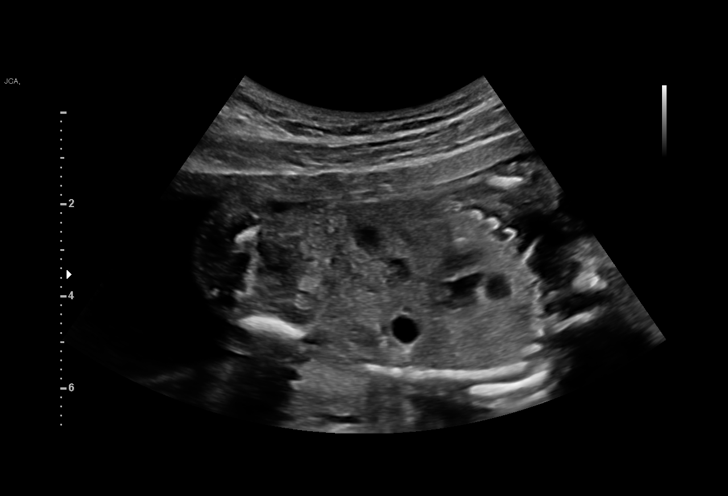
[im 49/82]
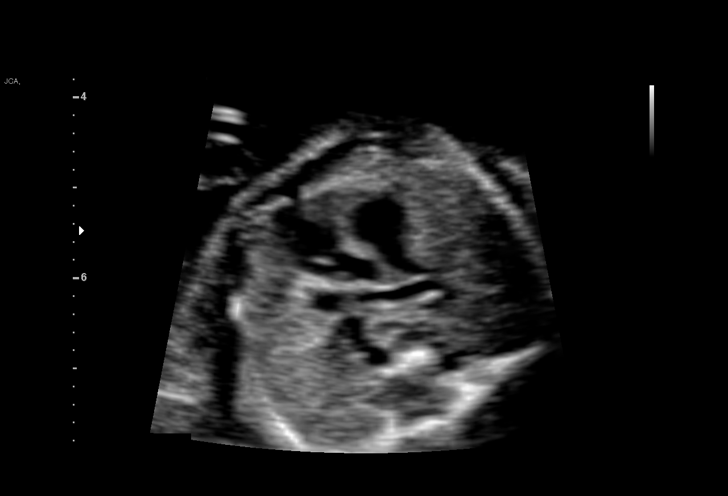
[im 56/82]
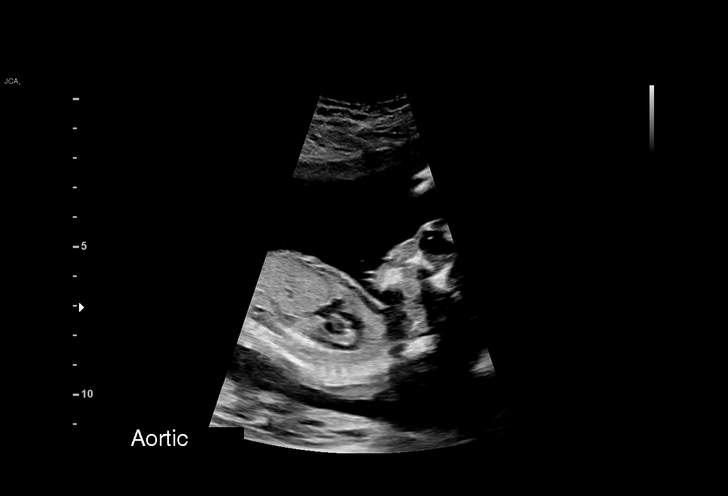
[im 62/82]
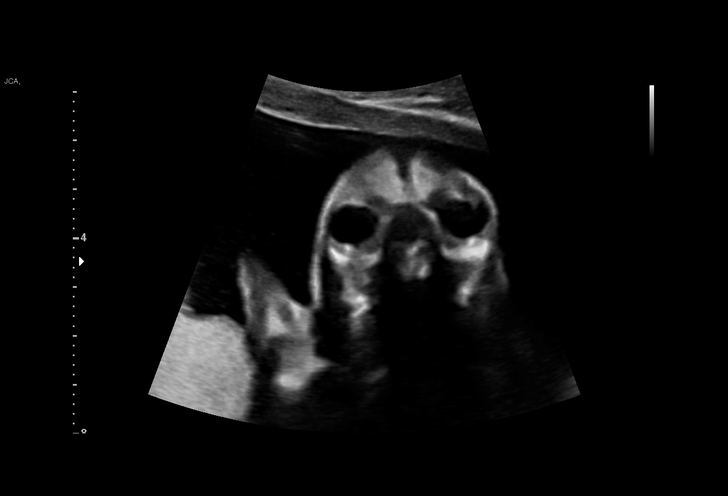
[im 69/82]
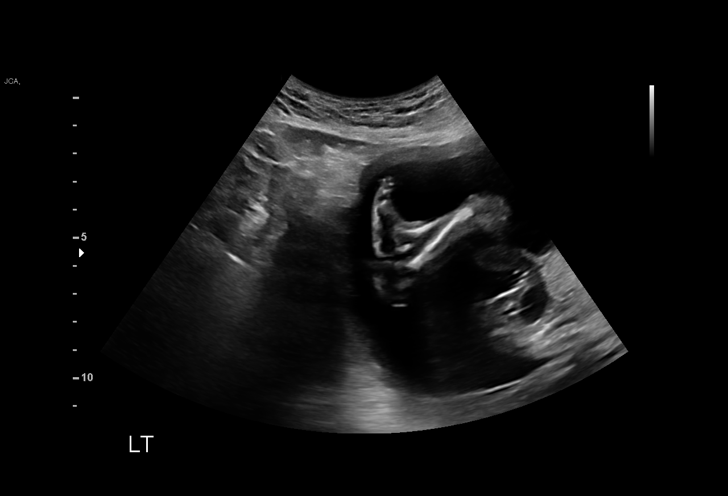
[im 75/82]
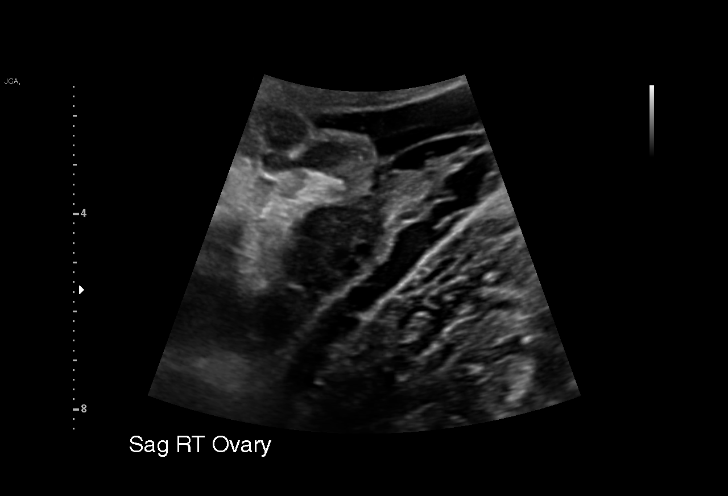
[im 82/82]
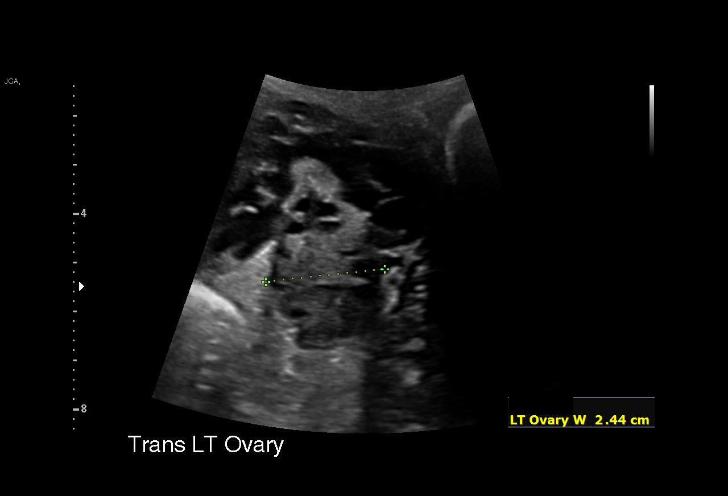

[Series 3: us mfm ob comp +14 wks · 1 of 6 slices shown (2 of 2)]
[im 6/6]
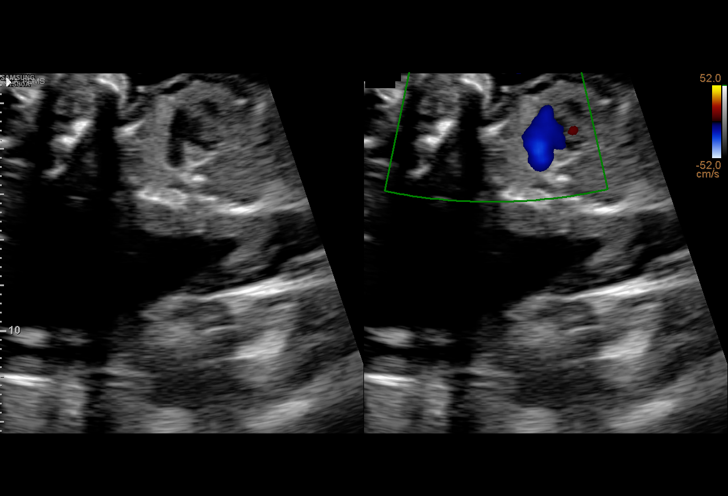

[15 of 28 positions shown; findings below may reference images not displayed]

Addendum:\.br----------------------------------------------------------------------
----------------------------------------------------------------------

----------------------------------------------------------------------

----------------------------------------------------------------------

 1  US MFM OB COMP + 14 WK                76805.01    JANAY BICKEL
----------------------------------------------------------------------

----------------------------------------------------------------------
Indications

 Asthma                                         BLL.GL [id number]
 Seizure disorder
 19 weeks gestation of pregnancy
 Encounter for antenatal screening for
 malformations
 Low Risk NIPS

## 2022-08-30 ENCOUNTER — Ambulatory Visit (INDEPENDENT_AMBULATORY_CARE_PROVIDER_SITE_OTHER): Payer: Medicare Other

## 2022-08-30 DIAGNOSIS — Z3042 Encounter for surveillance of injectable contraceptive: Secondary | ICD-10-CM

## 2022-08-30 DIAGNOSIS — Z3480 Encounter for supervision of other normal pregnancy, unspecified trimester: Secondary | ICD-10-CM

## 2022-08-30 LAB — POCT URINE PREGNANCY: Preg Test, Ur: NEGATIVE

## 2022-08-30 MED ORDER — PREPLUS 27-1 MG PO TABS: 1.0000 | ORAL_TABLET | Freq: Every day | ORAL | 13 refills | Status: AC

## 2022-08-30 MED ORDER — MEDROXYPROGESTERONE ACETATE 150 MG/ML IM SUSP
150.0000 mg | INTRAMUSCULAR | Status: AC
  Administered 2022-08-30: 150 mg via INTRAMUSCULAR

## 2022-08-30 NOTE — Progress Notes (Signed)
Pt is in the office for 2nd UPT for depo restart In office UPT is negative today Administered depo in and pt tolerated well. Next due March 21 - April 4 .. Administrations This Visit     medroxyPROGESTERone (DEPO-PROVERA) injection 150 mg     Admin Date 08/30/2022 Action Given Dose 150 mg Route Intramuscular Administered By Hinton Lovely, RN

## 2022-10-10 ENCOUNTER — Other Ambulatory Visit: Payer: Self-pay

## 2022-10-10 ENCOUNTER — Ambulatory Visit
Admission: EM | Admit: 2022-10-10 | Discharge: 2022-10-10 | Disposition: A | Discharge status: Home / Self Care | Payer: 59 | Attending: Family Medicine | Admitting: Family Medicine

## 2022-10-10 ENCOUNTER — Encounter: Payer: Self-pay | Admitting: Emergency Medicine

## 2022-10-10 DIAGNOSIS — J4521 Mild intermittent asthma with (acute) exacerbation: Secondary | ICD-10-CM | POA: Diagnosis not present

## 2022-10-10 DIAGNOSIS — J45909 Unspecified asthma, uncomplicated: Secondary | ICD-10-CM

## 2022-10-10 MED ORDER — ALBUTEROL SULFATE HFA 108 (90 BASE) MCG/ACT IN AERS: 2.0000 | INHALATION_SPRAY | Freq: Four times a day (QID) | RESPIRATORY_TRACT | 0 refills | Status: AC | PRN

## 2022-10-10 MED ORDER — ALBUTEROL SULFATE HFA 108 (90 BASE) MCG/ACT IN AERS: 2.0000 | INHALATION_SPRAY | Freq: Four times a day (QID) | RESPIRATORY_TRACT | 0 refills | Status: DC | PRN

## 2022-10-10 NOTE — ED Provider Notes (Addendum)
[name]    CSN: [id number] Arrival date & time: 10/10/22  1536      History   Chief Complaint Chief Complaint  Patient presents with   Asthma    HPI Sherry Colon is a 27 y.o. female.   Patient has a h/o of asthma.  She has been coughing the last several days and she is out of her albuterol inhaler.  She has pain with coughing.  No major wheezing or sob.  Cough is mild.  No fevers/chills.  No runny nose.           Past Medical History:  Diagnosis Date   Asthma    Fibromyalgia    Headache    Positive GBS test 10/01/2021   Treat in labor   Seizures ([place])    last seziure when she was in middle school    Patient Active Problem List   Diagnosis Date Noted   Postpartum care following vaginal delivery 12/07/2021   Seizures ([place]) 05/04/2021   Fibromyalgia 05/04/2021    Past Surgical History:  Procedure Laterality Date   TONSILLECTOMY      OB History     Gravida  2   Para  2   Term  2   Preterm      AB      Living  2      SAB      IAB      Ectopic      Multiple  0   Live Births  2            Home Medications    Prior to Admission medications   Medication Sig Start Date End Date Taking? Authorizing Provider  acetaminophen (TYLENOL) 325 MG tablet Take 2 tablets (650 mg total) by mouth every 4 (four) hours as needed (for pain scale < 4). Patient not taking: Reported on 04/12/2022 10/22/21   Elvera Maria, CNM  albuterol (VENTOLIN HFA) 108 (90 Base) MCG/ACT inhaler Inhale 2 puffs into the lungs every 6 (six) hours as needed for wheezing or shortness of breath. Patient not taking: Reported on 04/12/2022 05/04/21   Luvenia Redden, PA-C  medroxyPROGESTERone (DEPO-PROVERA) 150 MG/ML injection Inject 1 mL (150 mg total) into the muscle every 3 (three) months. 01/02/22   Chancy Milroy, MD  metroNIDAZOLE (FLAGYL) 500 MG tablet Take 1 tablet (500 mg total) by mouth 2 (two) times daily. Patient not taking: Reported on  08/30/2022 04/14/22   Chancy Milroy, MD  Misc. Devices (GOJJI WEIGHT SCALE) MISC 1 Device by Does not apply route every 30 (thirty) days. Patient not taking: Reported on 08/30/2022 03/23/21   Chancy Milroy, MD  Prenatal Vit-Fe Fumarate-FA (PREPLUS) 27-1 MG TABS Take 1 tablet by mouth daily. 08/30/22   Shelly Bombard, MD    Family History Family History  Problem Relation Age of Onset   Heart disease Mother    Diabetes Mother    Other Mother     Social History Social History   Tobacco Use   Smoking status: Never   Smokeless tobacco: Never  Vaping Use   Vaping Use: Never used  Substance Use Topics   Alcohol use: Never   Drug use: Never     Allergies   Cinnamon, Montelukast, Orange oil, and Loratadine   Review of Systems Review of Systems  Constitutional: Negative.   HENT: Negative.    Respiratory:  Positive for cough. Negative for shortness of breath and wheezing.   Cardiovascular:  Negative.   Gastrointestinal: Negative.   Genitourinary: Negative.   Musculoskeletal: Negative.      Physical Exam Triage Vital Signs ED Triage Vitals [10/10/22 1550]  Enc Vitals Group     BP 127/75     Pulse Rate 97     Resp 18     Temp 98.5 F (36.9 C)     Temp Source Oral     SpO2 97 %     Weight      Height      Head Circumference      Peak Flow      Pain Score 0     Pain Loc      Pain Edu?      Excl. in [place]?    No data found.  Updated Vital Signs BP 127/75 (BP Location: Left Arm)   Pulse 97   Temp 98.5 F (36.9 C) (Oral)   Resp 18   SpO2 97%   Visual Acuity Right Eye Distance:   Left Eye Distance:   Bilateral Distance:    Right Eye Near:   Left Eye Near:    Bilateral Near:     Physical Exam Constitutional:      Appearance: Normal appearance.  HENT:     Mouth/Throat:     Mouth: Mucous membranes are moist.  Cardiovascular:     Rate and Rhythm: Normal rate and regular rhythm.  Pulmonary:     Effort: Pulmonary effort is normal.     Breath sounds:  Normal breath sounds.  Musculoskeletal:     Cervical back: Normal range of motion.  Skin:    General: Skin is warm.  Neurological:     General: No focal deficit present.     Mental Status: She is alert.  Psychiatric:        Mood and Affect: Mood normal.      UC Treatments / Results  Labs (all labs ordered are listed, but only abnormal results are displayed) Labs Reviewed - No data to display  EKG   Radiology No results found.  Procedures Procedures (including critical care time)  Medications Ordered in UC Medications - No data to display  Initial Impression / Assessment and Plan / UC Course  I have reviewed the triage vital signs and the nursing notes.  Pertinent labs & imaging results that were available during my care of the patient were reviewed by me and considered in my medical decision making (see chart for details).    Final Clinical Impressions(s) / UC Diagnoses   Final diagnoses:  Mild intermittent asthma with acute exacerbation     Discharge Instructions      You were seen today for cough.  Your exam was normal today.  I have sent out an albuterol inhaler today.  If you have worsening cough, or develops shortness of breath or wheezing then please return for re-evaluation.     ED Prescriptions     Medication Sig Dispense Auth. Provider   albuterol (VENTOLIN HFA) 108 (90 Base) MCG/ACT inhaler Inhale 2 puffs into the lungs every 6 (six) hours as needed for wheezing or shortness of breath. 1 each Rondel Oh, MD      PDMP not reviewed this encounter.   Rondel Oh, MD 10/10/22 1607    Rondel Oh, MD 10/10/22 [phone]

## 2022-10-10 NOTE — ED Triage Notes (Signed)
Pt here for asthma sx with cough x 2 days; needs RX for inhaler

## 2022-10-10 NOTE — Discharge Instructions (Signed)
You were seen today for cough.  Your exam was normal today.  I have sent out an albuterol inhaler today.  If you have worsening cough, or develops shortness of breath or wheezing then please return for re-evaluation.

## 2022-10-17 ENCOUNTER — Other Ambulatory Visit: Payer: Self-pay

## 2022-10-17 ENCOUNTER — Encounter (HOSPITAL_COMMUNITY): Payer: Self-pay | Admitting: Emergency Medicine

## 2022-10-17 ENCOUNTER — Emergency Department (HOSPITAL_COMMUNITY)
Admission: EM | Admit: 2022-10-17 | Discharge: 2022-10-17 | Disposition: A | Discharge status: Home / Self Care | Payer: 59 | Attending: Emergency Medicine | Admitting: Emergency Medicine

## 2022-10-17 DIAGNOSIS — R058 Other specified cough: Secondary | ICD-10-CM | POA: Insufficient documentation

## 2022-10-17 DIAGNOSIS — J4521 Mild intermittent asthma with (acute) exacerbation: Secondary | ICD-10-CM | POA: Diagnosis not present

## 2022-10-17 DIAGNOSIS — R059 Cough, unspecified: Secondary | ICD-10-CM | POA: Diagnosis present

## 2022-10-17 DIAGNOSIS — Z1152 Encounter for screening for COVID-19: Secondary | ICD-10-CM | POA: Diagnosis not present

## 2022-10-17 LAB — RESP PANEL BY RT-PCR (RSV, FLU A&B, COVID)  RVPGX2
Influenza A by PCR: NEGATIVE
Influenza B by PCR: NEGATIVE
Resp Syncytial Virus by PCR: NEGATIVE
SARS Coronavirus 2 by RT PCR: NEGATIVE

## 2022-10-17 MED ORDER — ACETAMINOPHEN 500 MG PO TABS
1000.0000 mg | ORAL_TABLET | Freq: Once | ORAL | Status: AC
  Administered 2022-10-17: 1000 mg via ORAL
  Filled 2022-10-17: qty 2

## 2022-10-17 MED ORDER — DEXAMETHASONE 4 MG PO TABS
6.0000 mg | ORAL_TABLET | Freq: Once | ORAL | Status: AC
  Administered 2022-10-17: 6 mg via ORAL
  Filled 2022-10-17: qty 1

## 2022-10-17 NOTE — ED Provider Notes (Signed)
[facility] Provider Note   CSN: [id number] Arrival date & time: 10/17/22  2015     History No chief complaint on file.   HPI Sherry Colon is a 27 y.o. female presenting for cough.  States that her child has a URI and she has a history of asthma and the patient has developed worsening cough throughout the day.  She is otherwise healthy up-to-date on vaccines.  No history of intubation or severe asthma.  Had mostly grown out of it but has been needing her inhaler more frequently this week.  Patient denies nausea vomiting, syncope shortness of breath or chest pain.  Otherwise ambulatory tolerating p.o. intake..   Patient's recorded medical, surgical, social, medication list and allergies were reviewed in the Snapshot window as part of the initial history.   Review of Systems   Review of Systems  Constitutional:  Negative for chills and fever.  HENT:  Negative for ear pain and sore throat.   Eyes:  Negative for pain and visual disturbance.  Respiratory:  Positive for cough. Negative for shortness of breath.   Cardiovascular:  Negative for chest pain and palpitations.  Gastrointestinal:  Negative for abdominal pain and vomiting.  Genitourinary:  Negative for dysuria and hematuria.  Musculoskeletal:  Negative for arthralgias and back pain.  Skin:  Negative for color change and rash.  Neurological:  Negative for seizures and syncope.  All other systems reviewed and are negative.   Physical Exam Updated Vital Signs BP 127/75   Pulse (!) 111   Temp 98.6 F (37 C) (Oral)   Resp 20   Wt 56.7 kg   SpO2 97%   BMI 22.86 kg/m  Physical Exam Vitals and nursing note reviewed.  Constitutional:      General: She is not in acute distress.    Appearance: She is well-developed.  HENT:     Head: Normocephalic and atraumatic.  Eyes:     Conjunctiva/sclera: Conjunctivae normal.  Cardiovascular:     Rate and Rhythm: Normal rate and regular  rhythm.     Heart sounds: No murmur heard. Pulmonary:     Effort: Pulmonary effort is normal. No respiratory distress.     Breath sounds: Normal breath sounds.  Abdominal:     General: There is no distension.     Palpations: Abdomen is soft.     Tenderness: There is no abdominal tenderness. There is no right CVA tenderness or left CVA tenderness.  Musculoskeletal:        General: No swelling or tenderness. Normal range of motion.     Cervical back: Neck supple.  Skin:    General: Skin is warm and dry.  Neurological:     General: No focal deficit present.     Mental Status: She is alert and oriented to person, place, and time. Mental status is at baseline.     Cranial Nerves: No cranial nerve deficit.      ED Course/ Medical Decision Making/ A&P    Procedures Procedures   Medications Ordered in ED Medications  dexamethasone (DECADRON) tablet 6 mg (6 mg Oral Given 10/17/22 2103)    Medical Decision Making:    Sherry Colon is a 27 y.o. female who presented to the ED today with worsening cough detailed above.     Patient's presentation is complicated by their history of asthma.  Patient placed on continuous vitals and telemetry monitoring while in ED which was reviewed periodically.   Complete initial  physical exam performed, notably the patient  was hemodynamically stable in no acute distress.  No active wheezing at this time.      Reviewed and confirmed nursing documentation for past medical history, family history, social history.    Initial Assessment:   This a 27 year old female with history of asthma presenting with congestion and worsening cough.  Likely asthma exacerbation.  Albuterol as needed is assisting with management of wheezing, however cough is persistent.  Will trial dose of Decadron in emergency department and plan outpatient follow-up with PCP within 48 hours for reassessment.  Currently well-appearing in no acute distress stable for outpatient care and  management.  COVID flu screening requested by patient.  Notably, patient's daughter tested for flu be positive this evening.  Likely underlying etiology of patient's symptoms as well given that her test was negative, she got sick following infection of her child.  Likely early in the incubation period.  Offered oseltamavir, however patient does not want to proceed with this therapy at this time.  Disposition:  I have considered need for hospitalization, however, considering all of the above, I believe this patient is stable for discharge at this time.  Patient/family educated about specific return precautions for given chief complaint and symptoms.  Patient/family educated about follow-up with PCP.     Patient/family expressed understanding of return precautions and need for follow-up. Patient spoken to regarding all imaging and laboratory results and appropriate follow up for these results. All education provided in verbal form with additional information in written form. Time was allowed for answering of patient questions. Patient discharged.    Emergency Department Medication Summary:   Medications  dexamethasone (DECADRON) tablet 6 mg (6 mg Oral Given 10/17/22 2103)    Clinical Impression:  1. Mild intermittent asthma with acute exacerbation      Discharge   Final Clinical Impression(s) / ED Diagnoses Final diagnoses:  Mild intermittent asthma with acute exacerbation    Rx / DC Orders ED Discharge Orders     None         Tretha Sciara, MD 10/17/22 2152

## 2022-10-17 NOTE — ED Triage Notes (Signed)
Presents from [facility] for asthma exacerbation, has been using albuterol inhaler every 2 hr without relief.  Endorses SOB, CP  No wheeze noted in triage, speaking in full sentences  Has not needed hospitalization as an adult for asthma

## 2022-10-25 ENCOUNTER — Telehealth: Payer: Self-pay

## 2022-10-25 NOTE — Telephone Encounter (Signed)
        Patient  visited [place] on 2/20   Telephone encounter attempt :  1st  A HIPAA compliant voice message was left requesting a return call.  Instructed patient to call back .   Beatrice [phone] [address], [place], [place] [address] Phone: [phone] Email: [email or web address]

## 2022-11-02 ENCOUNTER — Ambulatory Visit (INDEPENDENT_AMBULATORY_CARE_PROVIDER_SITE_OTHER): Payer: 59

## 2022-11-02 ENCOUNTER — Other Ambulatory Visit (HOSPITAL_COMMUNITY)
Admission: RE | Admit: 2022-11-02 | Discharge: 2022-11-02 | Disposition: A | Discharge status: Home / Self Care | Payer: 59 | Source: Ambulatory Visit | Attending: Obstetrics & Gynecology | Admitting: Obstetrics & Gynecology

## 2022-11-02 DIAGNOSIS — Z8619 Personal history of other infectious and parasitic diseases: Secondary | ICD-10-CM | POA: Insufficient documentation

## 2022-11-02 DIAGNOSIS — A749 Chlamydial infection, unspecified: Secondary | ICD-10-CM

## 2022-11-02 NOTE — Progress Notes (Signed)
..  SUBJECTIVE:  27 y.o. female is in the office for test of cure, following +CT on 04/12/2022. Denies abnormal vaginal bleeding or significant pelvic pain or fever. No UTI symptoms.   No LMP recorded.  OBJECTIVE:  She appears well, afebrile. Urine dipstick: not done.  ASSESSMENT:  Test of Cure, previously positive for CT   PLAN:  GC, chlamydia, trichomonas, BVAG, CVAG probe sent to lab. Treatment: To be determined once lab results are received ROV prn if symptoms persist or worsen.

## 2022-11-03 LAB — CERVICOVAGINAL ANCILLARY ONLY
Bacterial Vaginitis (gardnerella): NEGATIVE
Candida Glabrata: NEGATIVE
Candida Vaginitis: NEGATIVE
Chlamydia: POSITIVE — AB
Comment: NEGATIVE
Comment: NEGATIVE
Comment: NEGATIVE
Comment: NEGATIVE
Comment: NEGATIVE
Comment: NORMAL
Neisseria Gonorrhea: NEGATIVE
Trichomonas: NEGATIVE

## 2022-11-04 MED ORDER — DOXYCYCLINE HYCLATE 100 MG PO CAPS: 100.0000 mg | ORAL_CAPSULE | Freq: Two times a day (BID) | ORAL | 1 refills | Status: AC

## 2022-11-04 NOTE — Addendum Note (Signed)
Addended by: Verita Schneiders A on: 11/04/2022 12:42 AM   Modules accepted: Orders

## 2022-11-04 NOTE — Progress Notes (Signed)
Chlamydia is still present.  Recommend re-treatment and patient also needs to let partner(s) know so the partner(s) can get repeat testing and treatment. Patient and sex partner(s) should abstain from unprotected sexual activity for at least seven days after everyone receives appropriate treatment.  Doxycycline was prescribed for patient.  Patient will need to return in about 4 weeks after treatment for repeat test of cure.  Please call to inform patient of results and recommendations, and advise to pick up prescription and take as directed.  Please advise patient to practice safe sex at all times.

## 2022-11-07 NOTE — Progress Notes (Signed)
TC. No answer. Left HIPAA compliant VM with call back number. MyChart message sent with information on DX, RX, and need for treatment of partner(s) and abstinence until [place] complete. Pt education included in message.

## 2022-11-09 ENCOUNTER — Telehealth: Payer: Self-pay | Admitting: Emergency Medicine

## 2022-11-09 NOTE — Telephone Encounter (Signed)
Attempted TC to patient. LVM. Patient active on mychart.

## 2022-11-09 NOTE — Telephone Encounter (Signed)
-----   Message from Penny Pia, RN sent at 11/07/2022 10:30 AM EDT ----- TC. No answer. Left HIPAA compliant VM with call back number. MyChart message sent with information on DX, RX, and need for treatment of partner(s) and abstinence until [place] complete. Pt education included in message.

## 2022-11-28 ENCOUNTER — Ambulatory Visit: Payer: 59

## 2023-01-11 ENCOUNTER — Ambulatory Visit
Admission: RE | Admit: 2023-01-11 | Discharge: 2023-01-11 | Disposition: A | Discharge status: Home / Self Care | Payer: 59 | Source: Ambulatory Visit | Attending: Family Medicine | Admitting: Family Medicine

## 2023-01-11 ENCOUNTER — Ambulatory Visit: Payer: Self-pay

## 2023-01-11 VITALS — BP 119/79 | HR 66 | Temp 98.9°F | Resp 16

## 2023-01-11 DIAGNOSIS — J4521 Mild intermittent asthma with (acute) exacerbation: Secondary | ICD-10-CM

## 2023-01-11 DIAGNOSIS — J069 Acute upper respiratory infection, unspecified: Secondary | ICD-10-CM | POA: Diagnosis not present

## 2023-01-11 MED ORDER — BENZONATATE 100 MG PO CAPS: 100.0000 mg | ORAL_CAPSULE | Freq: Three times a day (TID) | ORAL | 0 refills | Status: DC | PRN

## 2023-01-11 MED ORDER — PREDNISONE 20 MG PO TABS: 40.0000 mg | ORAL_TABLET | Freq: Every day | ORAL | 0 refills | Status: AC

## 2023-01-11 NOTE — Discharge Instructions (Signed)
Take benzonatate 100 mg, 1 tab every 8 hours as needed for cough.  Take prednisone 20 mg--2 daily for 5 days

## 2023-01-11 NOTE — ED Triage Notes (Signed)
Pt states cough and runny nose for the past 3 days. 

## 2023-01-11 NOTE — ED Provider Notes (Signed)
[name]    CSN: [id number] Arrival date & time: 01/11/23  1815      History   Chief Complaint Chief Complaint  Patient presents with   Cough    Cough, sneezing, running nose - Entered by patient    HPI Sherry Colon is a 27 y.o. female.    Cough  Here for cough and congestion and rhinorrhea.  Symptoms actually began on May 10.  She states that taking some over-the-counter cold and flu medicine has helped her improved, but she still having some cough and her asthma is giving her trouble.    Past Medical History:  Diagnosis Date   Asthma    Chlamydia    Fibromyalgia    Headache    Positive GBS test 10/01/2021   Treat in labor   Seizures ([facility])    last seziure when she was in middle school    Patient Active Problem List   Diagnosis Date Noted   Postpartum care following vaginal delivery 12/07/2021   Seizures ([facility]) 05/04/2021   Fibromyalgia 05/04/2021    Past Surgical History:  Procedure Laterality Date   TONSILLECTOMY      OB History     Gravida  2   Para  2   Term  2   Preterm      AB      Living  2      SAB      IAB      Ectopic      Multiple  0   Live Births  2            Home Medications    Prior to Admission medications   Medication Sig Start Date End Date Taking? Authorizing Provider  benzonatate (TESSALON) 100 MG capsule Take 1 capsule (100 mg total) by mouth 3 (three) times daily as needed for cough. 01/11/23  Yes Zenia Resides, MD  predniSONE (DELTASONE) 20 MG tablet Take 2 tablets (40 mg total) by mouth daily with breakfast for 5 days. 01/11/23 01/16/23 Yes Merick Kelleher, Janace Aris, MD  albuterol (VENTOLIN HFA) 108 (90 Base) MCG/ACT inhaler Inhale 2 puffs into the lungs every 6 (six) hours as needed for wheezing or shortness of breath. 10/10/22   Piontek, Denny Peon, MD  medroxyPROGESTERone (DEPO-PROVERA) 150 MG/ML injection Inject 1 mL (150 mg total) into the muscle every 3 (three) months. 01/02/22   Hermina Staggers, MD  Prenatal Vit-Fe Fumarate-FA (PREPLUS) 27-1 MG TABS Take 1 tablet by mouth daily. 08/30/22   Brock Bad, MD    Family History Family History  Problem Relation Age of Onset   Heart disease Mother    Diabetes Mother    Other Mother     Social History Social History   Tobacco Use   Smoking status: Never   Smokeless tobacco: Never  Vaping Use   Vaping Use: Never used  Substance Use Topics   Alcohol use: Never   Drug use: Never     Allergies   Cinnamon, Montelukast, Orange oil, and Loratadine   Review of Systems Review of Systems  Respiratory:  Positive for cough.      Physical Exam Triage Vital Signs ED Triage Vitals  Enc Vitals Group     BP 01/11/23 1829 119/79     Pulse Rate 01/11/23 1829 66     Resp 01/11/23 1829 16     Temp 01/11/23 1829 98.9 F (37.2 C)     Temp Source 01/11/23 1829 Oral  SpO2 01/11/23 1829 97 %     Weight --      Height --      Head Circumference --      Peak Flow --      Pain Score 01/11/23 1830 0     Pain Loc --      Pain Edu? --      Excl. in [place]? --    No data found.  Updated Vital Signs BP 119/79 (BP Location: Left Arm)   Pulse 66   Temp 98.9 F (37.2 C) (Oral)   Resp 16   LMP  (LMP Unknown)   SpO2 97%   Breastfeeding No   Visual Acuity Right Eye Distance:   Left Eye Distance:   Bilateral Distance:    Right Eye Near:   Left Eye Near:    Bilateral Near:     Physical Exam Vitals reviewed.  Constitutional:      General: She is not in acute distress.    Appearance: She is not toxic-appearing.  HENT:     Right Ear: Tympanic membrane and ear canal normal.     Left Ear: Tympanic membrane and ear canal normal.     Nose: Nose normal.     Mouth/Throat:     Mouth: Mucous membranes are moist.     Pharynx: No oropharyngeal exudate or posterior oropharyngeal erythema.  Eyes:     Extraocular Movements: Extraocular movements intact.     Conjunctiva/sclera: Conjunctivae normal.     Pupils: Pupils are  equal, round, and reactive to light.  Cardiovascular:     Rate and Rhythm: Normal rate and regular rhythm.     Heart sounds: No murmur heard. Pulmonary:     Effort: Pulmonary effort is normal. No respiratory distress.     Breath sounds: No stridor. No wheezing, rhonchi or rales.  Musculoskeletal:     Cervical back: Neck supple.  Lymphadenopathy:     Cervical: No cervical adenopathy.  Skin:    Capillary Refill: Capillary refill takes less than 2 seconds.     Coloration: Skin is not jaundiced or pale.  Neurological:     General: No focal deficit present.     Mental Status: She is alert and oriented to person, place, and time.  Psychiatric:        Behavior: Behavior normal.      UC Treatments / Results  Labs (all labs ordered are listed, but only abnormal results are displayed) Labs Reviewed - No data to display  EKG   Radiology No results found.  Procedures Procedures (including critical care time)  Medications Ordered in UC Medications - No data to display  Initial Impression / Assessment and Plan / UC Course  I have reviewed the triage vital signs and the nursing notes.  Pertinent labs & imaging results that were available during my care of the patient were reviewed by me and considered in my medical decision making (see chart for details).      She is past the time with viral swabs would be helpful.  Tessalon Perles are sent in for cough and prednisone is sent in for her asthma exacerbation. HerFinal Clinical Impressions(s) / UC Diagnoses   Final diagnoses:  Viral URI  Mild intermittent asthma with acute exacerbation     Discharge Instructions      Take benzonatate 100 mg, 1 tab every 8 hours as needed for cough.  Take prednisone 20 mg--2 daily for 5 days       ED  Prescriptions     Medication Sig Dispense Auth. Provider   benzonatate (TESSALON) 100 MG capsule Take 1 capsule (100 mg total) by mouth 3 (three) times daily as needed for cough. 21  capsule Zenia Resides, MD   predniSONE (DELTASONE) 20 MG tablet Take 2 tablets (40 mg total) by mouth daily with breakfast for 5 days. 10 tablet Marlinda Mike Janace Aris, MD      PDMP not reviewed this encounter.   Zenia Resides, MD 01/11/23 [phone]

## 2023-01-18 ENCOUNTER — Encounter: Payer: Self-pay | Admitting: Family Medicine

## 2023-01-18 ENCOUNTER — Other Ambulatory Visit (HOSPITAL_COMMUNITY)
Admission: RE | Admit: 2023-01-18 | Discharge: 2023-01-18 | Disposition: A | Discharge status: Home / Self Care | Payer: 59 | Source: Ambulatory Visit | Attending: Family Medicine | Admitting: Family Medicine

## 2023-01-18 ENCOUNTER — Ambulatory Visit (INDEPENDENT_AMBULATORY_CARE_PROVIDER_SITE_OTHER): Payer: 59 | Admitting: Family Medicine

## 2023-01-18 VITALS — BP 92/65 | HR 86 | Temp 98.6°F | Ht 62.0 in | Wt 130.0 lb

## 2023-01-18 DIAGNOSIS — D649 Anemia, unspecified: Secondary | ICD-10-CM

## 2023-01-18 DIAGNOSIS — T7840XD Allergy, unspecified, subsequent encounter: Secondary | ICD-10-CM

## 2023-01-18 DIAGNOSIS — A749 Chlamydial infection, unspecified: Secondary | ICD-10-CM

## 2023-01-18 DIAGNOSIS — J069 Acute upper respiratory infection, unspecified: Secondary | ICD-10-CM | POA: Diagnosis not present

## 2023-01-18 DIAGNOSIS — T7840XA Allergy, unspecified, initial encounter: Secondary | ICD-10-CM | POA: Insufficient documentation

## 2023-01-18 DIAGNOSIS — R569 Unspecified convulsions: Secondary | ICD-10-CM

## 2023-01-18 DIAGNOSIS — M797 Fibromyalgia: Secondary | ICD-10-CM

## 2023-01-18 DIAGNOSIS — Z7689 Persons encountering health services in other specified circumstances: Secondary | ICD-10-CM

## 2023-01-18 DIAGNOSIS — Z749 Problem related to care provider dependency, unspecified: Secondary | ICD-10-CM | POA: Diagnosis not present

## 2023-01-18 DIAGNOSIS — G44229 Chronic tension-type headache, not intractable: Secondary | ICD-10-CM | POA: Diagnosis not present

## 2023-01-18 DIAGNOSIS — E778 Other disorders of glycoprotein metabolism: Secondary | ICD-10-CM

## 2023-01-18 DIAGNOSIS — M359 Systemic involvement of connective tissue, unspecified: Secondary | ICD-10-CM

## 2023-01-18 DIAGNOSIS — Z23 Encounter for immunization: Secondary | ICD-10-CM

## 2023-01-18 HISTORY — DX: Acute upper respiratory infection, unspecified: J06.9

## 2023-01-18 MED ORDER — FLUTICASONE PROPIONATE 50 MCG/ACT NA SUSP: 1.0000 | Freq: Every day | NASAL | 2 refills | Status: AC

## 2023-01-18 NOTE — Assessment & Plan Note (Addendum)
Stable, no HA currently. Has more headaches with recent URI, though this has improved with improving URI symptoms. She is not taking any medications for this (appears she was on fioricet in the past). Discussed taking ibuprofen 600 mg every 8 hours and tylenol 1000 mg every 8 hours as needed for HA. Will also refer back to neurology to get established here (last visit was with [facility] in 2020) and for further recommendations.

## 2023-01-18 NOTE — Assessment & Plan Note (Signed)
Last Hgb 11.8 one year ago. She does not endorse any worrisome symptoms. Will obtain repeat today.

## 2023-01-18 NOTE — Patient Instructions (Signed)
It was great to see you today! Here's what we talked about:  I have referred you to rheum and neurology. We will get labs today. I will follow up results with you. I will send in more treatment for chlamydia if it is still positive. I will also get you your plasma donation sheet if your protein levels are higher. Continue to eat a balanced diet as in this packet. I have referred you for medicare wellness visit. Take tylenol 1000 mg every 8 hours with ibuprofen 600 mg every 8 hours as needed for headaches until you see your neurologist. I have given you an advanced directive form. Please fill this out in entirety. I have sent in flonase for your allergies to your pharmacy.  Please let me know if you have any other questions.  Dr. Phineas Real

## 2023-01-18 NOTE — Assessment & Plan Note (Signed)
Took flonase in the past, and this helped. She needs a refill today.

## 2023-01-18 NOTE — Progress Notes (Signed)
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 Myofascial pain syndrome  Fibromyalgia (previously on elavil 10mg  qhs but makes her too tired with her baby) Seizures (last when she was "little") Raynaud syndrome (previously on trental 400mg  TID) Anxiety and depression Migraine/chronic tension HA (previously on Fioricet, f/u with [facility] neurology, last 2020, negative MRI 2020)  Asthma VitD deficiency  GERD ADHD Menorrhagia Hx chlamydia (treated, had persistent infection, not able to attend repeat TOC appt) Hx +GBS when pregnant ([id number]) No hospitalizations  PSH: tonsillectomy  Meds: albuterol prn, on depo-provera (last dose on April 5th at home), flonase  Allergies: cinnamon, orange (both with facial and throat swelling) and montelukast (hives) but patient does not remember this  FH:  Mother: asthma, heart disease, HTN Sister: asthma  SH: lives with daughter in [place] (78 year old, [name] (AH-milla)), no substance use, sexually active with males, is in partnership with [facility], no advanced directive  OBJECTIVE:   BP 92/65   Pulse 86   Temp 98.6 F (37 C)   Ht 5\' 2"  (1.575 m)   Wt 59 kg   LMP  (LMP Unknown)   SpO2 97%   BMI 23.78 kg/m   General: Alert and oriented, in NAD Skin: Warm, dry, and intact; small scar on L shoulder HEENT: NCAT, EOM grossly normal, small conjunctival hemorrhages bilaterally, midline nasal septum with clear nares, oropharynx without lesions; mallampati class II Cardiac: RRR, no m/r/g appreciated Respiratory: CTAB, breathing and speaking comfortably on RA Abdominal: Soft, nontender, nondistended, normoactive bowel sounds Extremities: Moves all extremities grossly equally Neurological: No gross focal deficit, strength and sensation intact throughout Psychiatric: Appropriate mood and affect   ASSESSMENT/PLAN:   Undifferentiated connective tissue  disease (HCC) Last saw rheum in 2021 at [facility]. She continues to have diffuse pains related to this. My exam today unremarkable. She has not been on elavil given it makes her too drowsy to care for her baby. She would like to get back in with a local rheumatologist for further follow up. Will refer.  Seizures (HCC) No episodes since childhood.  Hypoproteinemia (HCC) Last total protein 5.9 on 11/07/22 according to document patient supplied for plasmapheresis. They require total protein >6. Patient would like to get levels rechecked to participate in donation. She has been eating much better than before, which should increase her levels. Repeated donations can lower total protein, however. Will recheck CMP today and fill out medical clearance for donation as indicated.  Fibromyalgia See above for rheumatology referral. No medications at this time. Discussed ibuprofen/tylenol use for pain in the meantime as below.  Chronic tension-type headache, not intractable Stable, no HA currently. Has more headaches with recent URI, though this has improved with improving URI symptoms. She is not taking any medications for this (appears she was on fioricet in the past). Discussed taking ibuprofen 600 mg every 8 hours and tylenol 1000 mg every 8 hours as needed for HA. Will also refer back to neurology to get established here (last visit was with [facility] in 2020) and for further recommendations.  Chlamydia Tested positive (after first round of treatment) on 11/04/2022. Given doxycyline at that time. Unable to return for [facility] given her baby was sick. Obtained self-swab today for TOC. Will f/u results.  Anemia Last Hgb 11.8 one year ago. She does not endorse any worrisome symptoms. Will obtain repeat today.  Allergies Took flonase in the past, and this helped.  She needs a refill today.  Viral URI with cough Improving over time. Still has cough. Exam findings of conjunctival hemorrhage could be due to cough. Reassured by  lack of visual difficulties. Should continue to improve, along with the cough, over time. Will follow up at next visit.   Health maintenance Next pap due in 2025. HIV/HCV screening completed. COVID vaccines completed x3. MAW referred. HPV given. Provided advanced directive form.  Janeal Holmes, MD [facility]

## 2023-01-18 NOTE — Assessment & Plan Note (Signed)
Tested positive (after first round of treatment) on 11/04/2022. Given doxycyline at that time. Unable to return for [facility] given her baby was sick. Obtained self-swab today for TOC. Will f/u results.

## 2023-01-18 NOTE — Assessment & Plan Note (Signed)
See above for rheumatology referral. No medications at this time. Discussed ibuprofen/tylenol use for pain in the meantime as below.

## 2023-01-18 NOTE — Assessment & Plan Note (Signed)
Last total protein 5.9 on 11/07/22 according to document patient supplied for plasmapheresis. They require total protein >6. Patient would like to get levels rechecked to participate in donation. Sherry Colon has been eating much better than before, which should increase her levels. Repeated donations can lower total protein, however. Will recheck CMP today and fill out medical clearance for donation as indicated.

## 2023-01-18 NOTE — Assessment & Plan Note (Signed)
Improving over time. Still has cough. Exam findings of conjunctival hemorrhage could be due to cough. Reassured by lack of visual difficulties. Should continue to improve, along with the cough, over time. Will follow up at next visit.

## 2023-01-18 NOTE — Assessment & Plan Note (Signed)
No episodes since childhood.

## 2023-01-18 NOTE — Assessment & Plan Note (Signed)
Last saw rheum in 2021 at [facility]. She continues to have diffuse pains related to this. My exam today unremarkable. She has not been on elavil given it makes her too drowsy to care for her baby. She would like to get back in with a local rheumatologist for further follow up. Will refer.

## 2023-01-19 LAB — CBC
Hematocrit: 43.3 % (ref 34.0–46.6)
Hemoglobin: 14.3 g/dL (ref 11.1–15.9)
MCH: 30.2 pg (ref 26.6–33.0)
MCHC: 33 g/dL (ref 31.5–35.7)
MCV: 92 fL (ref 79–97)
Platelets: 295 10*3/uL (ref 150–450)
RBC: 4.73 x10E6/uL (ref 3.77–5.28)
RDW: 12.6 % (ref 11.7–15.4)
WBC: 6.8 10*3/uL (ref 3.4–10.8)

## 2023-01-19 LAB — CERVICOVAGINAL ANCILLARY ONLY
Bacterial Vaginitis (gardnerella): POSITIVE — AB
Candida Glabrata: NEGATIVE
Candida Vaginitis: NEGATIVE
Chlamydia: NEGATIVE
Comment: NEGATIVE
Comment: NEGATIVE
Comment: NEGATIVE
Comment: NEGATIVE
Comment: NEGATIVE
Comment: NORMAL
Neisseria Gonorrhea: NEGATIVE
Trichomonas: NEGATIVE

## 2023-01-19 LAB — COMPREHENSIVE METABOLIC PANEL
ALT: 18 IU/L (ref 0–32)
AST: 13 IU/L (ref 0–40)
Albumin/Globulin Ratio: 1.7 (ref 1.2–2.2)
Albumin: 4.3 g/dL (ref 4.0–5.0)
Alkaline Phosphatase: 91 IU/L (ref 44–121)
BUN/Creatinine Ratio: 21 (ref 9–23)
BUN: 15 mg/dL (ref 6–20)
Bilirubin Total: <0.2 mg/dL (ref 0.0–1.2)
CO2: 23 mmol/L (ref 20–29)
Calcium: 9.1 mg/dL (ref 8.7–10.2)
Chloride: 105 mmol/L (ref 96–106)
Creatinine, Ser: 0.72 mg/dL (ref 0.57–1.00)
Globulin, Total: 2.6 g/dL (ref 1.5–4.5)
Glucose: 87 mg/dL (ref 70–99)
Potassium: 4.1 mmol/L (ref 3.5–5.2)
Sodium: 141 mmol/L (ref 134–144)
Total Protein: 6.9 g/dL (ref 6.0–8.5)
eGFR: 119 mL/min/{1.73_m2} (ref >59)

## 2023-01-22 ENCOUNTER — Encounter: Payer: Self-pay | Admitting: Family Medicine

## 2023-01-23 ENCOUNTER — Telehealth: Payer: Self-pay | Admitting: Family Medicine

## 2023-01-23 MED ORDER — METRONIDAZOLE 500 MG PO TABS: 500.0000 mg | ORAL_TABLET | Freq: Two times a day (BID) | ORAL | 0 refills | Status: AC

## 2023-01-23 NOTE — Telephone Encounter (Signed)
Completed plasmapheresis approval form for patient given total protein of 6.9 on 01/18/23 CMP. Form placed in RN box for pickup. Please add office stamp to form. Also, please have patient sign a release of information form from both her neurologist and rheumatologist while she is here. She is aware we need this information to complete her referrals.  Rheumatologist: [facility]  [address]  [place], [place] [address]  [phone]  Dorris Carnes, MD  [address]  [place], [place] [address]  [phone] (Work)  [phone] (Fax)    Neurologist: [facility]  [address]  [place], [place] [address]  [phone]  Lavone Neri., Minta Balsam, MD  [address]  [place], [place] [address]  [phone] (Work)  [phone] (Fax)

## 2023-01-25 NOTE — Telephone Encounter (Signed)
**  Delay in documentation On Tuesday, 5/28, patient came into clinic to pick up paperwork. Paperwork was provided to patient by [employer]. Copy made and placed in batch scanning.   Veronda Prude, RN

## 2023-02-04 NOTE — Progress Notes (Deleted)
Subjective:   Sherry Colon is a 27 y.o. female who presents for an Initial Medicare Annual Wellness Visit.  Review of Systems    ***       Objective:    There were no vitals filed for this visit. There is no height or weight on file to calculate BMI.     10/17/2022    8:30 PM 10/20/2021    8:09 AM 02/12/2019   12:50 PM 02/11/2019    9:20 PM  Advanced Directives  Does Patient Have a Medical Advance Directive? No No No No  Would patient like information on creating a medical advance directive? No - Patient declined No - Patient declined No - Patient declined No - Patient declined    Current Medications (verified) Outpatient Encounter Medications as of 02/05/2023  Medication Sig   albuterol (VENTOLIN HFA) 108 (90 Base) MCG/ACT inhaler Inhale 2 puffs into the lungs every 6 (six) hours as needed for wheezing or shortness of breath.   benzonatate (TESSALON) 100 MG capsule Take 1 capsule (100 mg total) by mouth 3 (three) times daily as needed for cough. (Patient not taking: Reported on 01/18/2023)   fluticasone (FLONASE) 50 MCG/ACT nasal spray Place 1 spray into both nostrils daily.   medroxyPROGESTERone (DEPO-PROVERA) 150 MG/ML injection Inject 1 mL (150 mg total) into the muscle every 3 (three) months. (Patient not taking: Reported on 01/18/2023)   Prenatal Vit-Fe Fumarate-FA (PREPLUS) 27-1 MG TABS Take 1 tablet by mouth daily. (Patient not taking: Reported on 01/18/2023)   Facility-Administered Encounter Medications as of 02/05/2023  Medication   medroxyPROGESTERone (DEPO-PROVERA) injection 150 mg    Allergies (verified) Cinnamon, Montelukast, Orange oil, and Loratadine   History: Past Medical History:  Diagnosis Date   Asthma    Chlamydia    Ehlers-Danlos syndrome possible    Fibromyalgia    Headache    Positive GBS test 10/01/2021   Treat in labor   Seizures (HCC)    last seziure when she was in middle school   Past Surgical History:  Procedure Laterality Date    TONSILLECTOMY     Family History  Problem Relation Age of Onset   Heart disease Mother    Diabetes Mother    Other Mother    Social History   Socioeconomic History   Marital status: Single    Spouse name: Not on file   Number of children: Not on file   Years of education: Not on file   Highest education level: Not on file  Occupational History   Not on file  Tobacco Use   Smoking status: Never   Smokeless tobacco: Never  Vaping Use   Vaping Use: Never used  Substance and Sexual Activity   Alcohol use: Never   Drug use: Never   Sexual activity: Yes  Other Topics Concern   Not on file  Social History Narrative   Not on file   Social Determinants of Health   Financial Resource Strain: Not on file  Food Insecurity: Not on file  Transportation Needs: Not on file  Physical Activity: Not on file  Stress: Not on file  Social Connections: Not on file    Tobacco Counseling Counseling given: Not Answered   Clinical Intake:                 Diabetic?No          Activities of Daily Living     No data to display  Patient Care Team: Evette Georges, MD as PCP - General (Family Medicine)  Indicate any recent Medical Services you may have received from other than [facility] providers in the past year (date may be approximate).     Assessment:   This is a routine wellness examination for [name].  Hearing/Vision screen No results found.  Dietary issues and exercise activities discussed:     Goals Addressed   None    Depression Screen    01/18/2023    2:06 PM 09/29/2021    1:34 PM 08/04/2021    9:06 AM 03/23/2021    1:45 PM  PHQ 2/9 Scores  PHQ - 2 Score 0 0 0 0  PHQ- 9 Score 0 1 2 3     Fall Risk     No data to display          FALL RISK PREVENTION PERTAINING TO THE HOME:  Any stairs in or around the home? {YES/NO:21197} If so, are there any without handrails? {YES/NO:21197} Home free of loose throw rugs in walkways, pet beds,  electrical cords, etc? {YES/NO:21197} Adequate lighting in your home to reduce risk of falls? {YES/NO:21197}  ASSISTIVE DEVICES UTILIZED TO PREVENT FALLS:  Life alert? {YES/NO:21197} Use of a cane, walker or w/c? {YES/NO:21197} Grab bars in the bathroom? {YES/NO:21197} Shower chair or bench in shower? {YES/NO:21197} Elevated toilet seat or a handicapped toilet? {YES/NO:21197}  TIMED UP AND GO:  Was the test performed? No . Telephonic visit   Cognitive Function:        Immunizations Immunization History  Administered Date(s) Administered   HPV 9-valent 01/18/2023   Influenza,inj,Quad PF,6+ Mos 07/07/2021   Moderna Sars-Covid-2 Vaccination 12/18/2019, 01/20/2020, 09/23/2020   Tdap 08/17/2021    TDAP status: Up to date  Covid-19 vaccine status: Information provided on how to obtain vaccines.   Qualifies for Shingles Vaccine? No    Screening Tests Health Maintenance  Topic Date Due   Medicare Annual Wellness (AWV)  Never done   COVID-19 Vaccine (4 - 2023-24 season) 04/28/2022   HPV VACCINES (2 - 3-dose series) 02/15/2023   INFLUENZA VACCINE  03/29/2023   PAP-Cervical Cytology Screening  05/04/2024   PAP SMEAR-Modifier  05/04/2024   DTaP/Tdap/Td (2 - Td or Tdap) 08/18/2031   Hepatitis C Screening  Completed   HIV Screening  Completed    Health Maintenance  Health Maintenance Due  Topic Date Due   Medicare Annual Wellness (AWV)  Never done   COVID-19 Vaccine (4 - 2023-24 season) 04/28/2022   HPV VACCINES (2 - 3-dose series) 02/15/2023    Lung Cancer Screening: (Low Dose CT Chest recommended if Age 66-80 years, 30 pack-year currently smoking OR have quit w/in 15years.) does not qualify.   Lung Cancer Screening Referral: n/a  Additional Screening:  Hepatitis C Screening: does qualify; Completed 05/04/21  Vision Screening: Recommended annual ophthalmology exams for early detection of glaucoma and other disorders of the eye. Is the patient up to date with their  annual eye exam?  {YES/NO:21197} Who is the provider or what is the name of the office in which the patient attends annual eye exams? *** If pt is not established with a provider, would they like to be referred to a provider to establish care? {YES/NO:21197}.   Dental Screening: Recommended annual dental exams for proper oral hygiene  Community Resource Referral / Chronic Care Management: CRR required this visit?  {YES/NO:21197}  CCM required this visit?  {YES/NO:21197}     Plan:     I have  personally reviewed and noted the following in the patient's chart:   Medical and social history Use of alcohol, tobacco or illicit drugs  Current medications and supplements including opioid prescriptions. {Opioid Prescriptions:[id number]} Functional ability and status Nutritional status Physical activity Advanced directives List of other physicians Hospitalizations, surgeries, and ER visits in previous 12 months Vitals Screenings to include cognitive, depression, and falls Referrals and appointments  In addition, I have reviewed and discussed with patient certain preventive protocols, quality metrics, and best practice recommendations. A written personalized care plan for preventive services as well as general preventive health recommendations were provided to patient.     Durwin Nora, [place]   09/02/1094   Due to this being a virtual visit, the after visit summary with patients personalized plan was offered to patient via mail or my-chart. ***Patient declined at this time./ Patient would like to access on my-chart/ per request, patient was mailed a copy of AVS./ Patient preferred to pick up at office at next visit  Nurse Notes: ***

## 2023-02-05 ENCOUNTER — Ambulatory Visit: Payer: 59

## 2023-02-12 ENCOUNTER — Ambulatory Visit (INDEPENDENT_AMBULATORY_CARE_PROVIDER_SITE_OTHER): Payer: 59

## 2023-02-12 VITALS — Ht 62.0 in | Wt 130.0 lb

## 2023-02-12 DIAGNOSIS — Z0000 Encounter for general adult medical examination without abnormal findings: Secondary | ICD-10-CM

## 2023-02-12 NOTE — Progress Notes (Cosign Needed Addendum)
Subjective:   Sherry Colon is a 27 y.o. female who presents for an Initial Medicare Annual Wellness Visit.  I connected with  Sherry Colon on 02/12/23 by a audio enabled telemedicine application and verified that I am speaking with the correct person using two identifiers.  Patient Location: Home  Provider Location: Home Office  I discussed the limitations of evaluation and management by telemedicine. The patient expressed understanding and agreed to proceed.  Review of Systems     Cardiac Risk Factors include: sedentary lifestyle     Objective:    Today's Vitals   02/12/23 1455  Weight: 130 lb (59 kg)  Height: 5\' 2"  (1.575 m)   Body mass index is 23.78 kg/m.     02/12/2023    2:57 PM 10/17/2022    8:30 PM 10/20/2021    8:09 AM 02/12/2019   12:50 PM 02/11/2019    9:20 PM  Advanced Directives  Does Patient Have a Medical Advance Directive? No No No No No  Would patient like information on creating a medical advance directive? Yes (MAU/Ambulatory/Procedural Areas - Information given) No - Patient declined No - Patient declined No - Patient declined No - Patient declined    Current Medications (verified) Outpatient Encounter Medications as of 02/12/2023  Medication Sig   albuterol (VENTOLIN HFA) 108 (90 Base) MCG/ACT inhaler Inhale 2 puffs into the lungs every 6 (six) hours as needed for wheezing or shortness of breath.   fluticasone (FLONASE) 50 MCG/ACT nasal spray Place 1 spray into both nostrils daily.   medroxyPROGESTERone (DEPO-PROVERA) 150 MG/ML injection Inject 1 mL (150 mg total) into the muscle every 3 (three) months.   Prenatal Vit-Fe Fumarate-FA (PREPLUS) 27-1 MG TABS Take 1 tablet by mouth daily.   [DISCONTINUED] benzonatate (TESSALON) 100 MG capsule Take 1 capsule (100 mg total) by mouth 3 (three) times daily as needed for cough.   Facility-Administered Encounter Medications as of 02/12/2023  Medication   medroxyPROGESTERone (DEPO-PROVERA) injection 150 mg     Allergies (verified) Cinnamon, Montelukast, Orange oil, and Loratadine   History: Past Medical History:  Diagnosis Date   Asthma    Chlamydia    Ehlers-Danlos syndrome possible    Fibromyalgia    Headache    Positive GBS test 10/01/2021   Treat in labor   Seizures (HCC)    last seziure when she was in middle school   Past Surgical History:  Procedure Laterality Date   TONSILLECTOMY     Family History  Problem Relation Age of Onset   Heart disease Mother    Diabetes Mother    Other Mother    Social History   Socioeconomic History   Marital status: Single    Spouse name: Not on file   Number of children: Not on file   Years of education: Not on file   Highest education level: Not on file  Occupational History   Not on file  Tobacco Use   Smoking status: Never   Smokeless tobacco: Never  Vaping Use   Vaping Use: Never used  Substance and Sexual Activity   Alcohol use: Never   Drug use: Never   Sexual activity: Yes  Other Topics Concern   Not on file  Social History Narrative   Not on file   Social Determinants of Health   Financial Resource Strain: Low Risk  (02/12/2023)   Overall Financial Resource Strain (CARDIA)    Difficulty of Paying Living Expenses: Not hard at all  Food Insecurity: No  Food Insecurity (02/12/2023)   Hunger Vital Sign    Worried About Running Out of Food in the Last Year: Never true    Ran Out of Food in the Last Year: Never true  Transportation Needs: No Transportation Needs (02/12/2023)   PRAPARE - Administrator, Civil Service (Medical): No    Lack of Transportation (Non-Medical): No  Physical Activity: Inactive (02/12/2023)   Exercise Vital Sign    Days of Exercise per Week: 0 days    Minutes of Exercise per Session: 0 min  Stress: No Stress Concern Present (02/12/2023)   [employer] of Occupational Health - Occupational Stress Questionnaire    Feeling of Stress : Not at all  Social Connections: Moderately  Isolated (02/12/2023)   Social Connection and Isolation Panel [NHANES]    Frequency of Communication with Friends and Family: More than three times a week    Frequency of Social Gatherings with Friends and Family: Three times a week    Attends Religious Services: More than 4 times per year    Active Member of Clubs or Organizations: No    Attends Banker Meetings: Never    Marital Status: Never married    Tobacco Counseling Counseling given: Not Answered   Clinical Intake:  Pre-visit preparation completed: Yes  Pain : No/denies pain  Diabetes: No  How often do you need to have someone help you when you read instructions, pamphlets, or other written materials from your doctor or pharmacy?: 1 - Never  Diabetic?No  Interpreter Needed?: No  Information entered by :: Kandis Fantasia LPN   Activities of Daily Living    02/12/2023    2:57 PM  In your present state of health, do you have any difficulty performing the following activities:  Hearing? 0  Vision? 0  Difficulty concentrating or making decisions? 0  Walking or climbing stairs? 0  Dressing or bathing? 0  Doing errands, shopping? 0  Preparing Food and eating ? N  Using the Toilet? N  In the past six months, have you accidently leaked urine? N  Do you have problems with loss of bowel control? N  Managing your Medications? N  Managing your Finances? N  Housekeeping or managing your Housekeeping? N    Patient Care Team: Evette Georges, MD as PCP - General (Family Medicine)  Indicate any recent Medical Services you may have received from other than [facility] providers in the past year (date may be approximate).     Assessment:   This is a routine wellness examination for Sherry Colon.  Hearing/Vision screen Hearing Screening - Comments:: Denies hearing difficulties   Vision Screening - Comments:: No vision problems; will schedule routine eye exam soon    Dietary issues and exercise activities  discussed: Current Exercise Habits: The patient does not participate in regular exercise at present   Goals Addressed             This Visit's Progress    Increase physical activity        Depression Screen    02/12/2023    2:58 PM 01/18/2023    2:06 PM 09/29/2021    1:34 PM 08/04/2021    9:06 AM 03/23/2021    1:45 PM  PHQ 2/9 Scores  PHQ - 2 Score 0 0 0 0 0  PHQ- 9 Score 0 0 1 2 3     Fall Risk    02/12/2023    2:57 PM  Fall Risk   Falls in the past  year? 0  Number falls in past yr: 0  Injury with Fall? 0  Risk for fall due to : No Fall Risks  Follow up Falls prevention discussed;Education provided;Falls evaluation completed    FALL RISK PREVENTION PERTAINING TO THE HOME:  Any stairs in or around the home? No  If so, are there any without handrails? No  Home free of loose throw rugs in walkways, pet beds, electrical cords, etc? Yes  Adequate lighting in your home to reduce risk of falls? Yes   ASSISTIVE DEVICES UTILIZED TO PREVENT FALLS:  Life alert? No  Use of a cane, walker or w/c? No  Grab bars in the bathroom? Yes  Shower chair or bench in shower? No  Elevated toilet seat or a handicapped toilet? No   TIMED UP AND GO:  Was the test performed? No . Telephonic visit   Cognitive Function:        02/12/2023    2:57 PM  6CIT Screen  What Year? 0 points  What month? 0 points  What time? 0 points  Count back from 20 0 points  Months in reverse 0 points  Repeat phrase 0 points  Total Score 0 points    Immunizations Immunization History  Administered Date(s) Administered   HPV 9-valent 01/18/2023   Influenza,inj,Quad PF,6+ Mos 07/07/2021   Moderna Sars-Covid-2 Vaccination 12/18/2019, 01/20/2020, 09/23/2020   Tdap 08/17/2021    TDAP status: Up to date  Pneumococcal vaccine status: Up to date  Covid-19 vaccine status: Information provided on how to obtain vaccines.   Qualifies for Shingles Vaccine? No    Screening Tests Health Maintenance   Topic Date Due   COVID-19 Vaccine (4 - 2023-24 season) 04/28/2022   HPV VACCINES (2 - 3-dose series) 02/15/2023   INFLUENZA VACCINE  03/29/2023   Medicare Annual Wellness (AWV)  02/12/2024   PAP-Cervical Cytology Screening  05/04/2024   PAP SMEAR-Modifier  05/04/2024   DTaP/Tdap/Td (2 - Td or Tdap) 08/18/2031   Hepatitis C Screening  Completed   HIV Screening  Completed    Health Maintenance  Health Maintenance Due  Topic Date Due   COVID-19 Vaccine (4 - 2023-24 season) 04/28/2022   HPV VACCINES (2 - 3-dose series) 02/15/2023    Lung Cancer Screening: (Low Dose CT Chest recommended if Age 74-80 years, 30 pack-year currently smoking OR have quit w/in 15years.) does not qualify.   Lung Cancer Screening Referral: n/a  Additional Screening:  Hepatitis C Screening: does qualify; Completed 05/04/21  Vision Screening: Recommended annual ophthalmology exams for early detection of glaucoma and other disorders of the eye. Is the patient up to date with their annual eye exam?  No  Who is the provider or what is the name of the office in which the patient attends annual eye exams? none If pt is not established with a provider, would they like to be referred to a provider to establish care? No .   Dental Screening: Recommended annual dental exams for proper oral hygiene  Community Resource Referral / Chronic Care Management: CRR required this visit?  No   CCM required this visit?  No      Plan:     I have personally reviewed and noted the following in the patient's chart:   Medical and social history Use of alcohol, tobacco or illicit drugs  Current medications and supplements including opioid prescriptions. Patient is not currently taking opioid prescriptions. Functional ability and status Nutritional status Physical activity Advanced directives List of other physicians  Hospitalizations, surgeries, and ER visits in previous 12 months Vitals Screenings to include  cognitive, depression, and falls Referrals and appointments  In addition, I have reviewed and discussed with patient certain preventive protocols, quality metrics, and best practice recommendations. A written personalized care plan for preventive services as well as general preventive health recommendations were provided to patient.     Durwin Nora, [place]   1/61/0960   Due to this being a virtual visit, the after visit summary with patients personalized plan was offered to patient via mail or my-chart.  Patient would like to access on my-chart  Colon Notes: No concerns

## 2023-02-12 NOTE — Patient Instructions (Signed)
Sherry Colon , Thank you for taking time to come for your Medicare Wellness Visit. I appreciate your ongoing commitment to your health goals. Please review the following plan we discussed and let me know if I can assist you in the future.   These are the goals we discussed:  Goals      Increase physical activity        This is a list of the screening recommended for you and due dates:  Health Maintenance  Topic Date Due   COVID-19 Vaccine (4 - 2023-24 season) 04/28/2022   HPV Vaccine (2 - 3-dose series) 02/15/2023   Flu Shot  03/29/2023   Medicare Annual Wellness Visit  02/12/2024   Pap Smear  05/04/2024   Pap Smear  05/04/2024   DTaP/Tdap/Td vaccine (2 - Td or Tdap) 08/18/2031   Hepatitis C Screening  Completed   HIV Screening  Completed    Advanced directives: Information on Advanced Care Planning can be found at [facility] Advance Health Care Directives Advance Health Care Directives ([email or web address] Please bring a copy of your health care power of attorney and living will to the office to be added to your chart at your convenience.  Conditions/risks identified: Aim for 30 minutes of exercise or brisk walking, 6-8 glasses of water, and 5 servings of fruits and vegetables each day.  Next appointment: Follow up in one year for your annual wellness visit.   Preventive Care 26-72 Years Old, Female Preventive care refers to lifestyle choices and visits with your health care provider that can promote health and wellness. Preventive care visits are also called wellness exams. What can I expect for my preventive care visit? Counseling During your preventive care visit, your health care provider may ask about your: Medical history, including: Past medical problems. Family medical history. Pregnancy history. Current health, including: Menstrual cycle. Method of birth control. Emotional well-being. Home life and relationship well-being. Sexual activity and sexual  health. Lifestyle, including: Alcohol, nicotine or tobacco, and drug use. Access to firearms. Diet, exercise, and sleep habits. Work and work Astronomer. Sunscreen use. Safety issues such as seatbelt and bike helmet use. Physical exam Your health care provider may check your: Height and weight. These may be used to calculate your BMI (body mass index). BMI is a measurement that tells if you are at a healthy weight. Waist circumference. This measures the distance around your waistline. This measurement also tells if you are at a healthy weight and may help predict your risk of certain diseases, such as type 2 diabetes and high blood pressure. Heart rate and blood pressure. Body temperature. Skin for abnormal spots. What immunizations do I need? Vaccines are usually given at various ages, according to a schedule. Your health care provider will recommend vaccines for you based on your age, medical history, and lifestyle or other factors, such as travel or where you work. What tests do I need? Screening Your health care provider may recommend screening tests for certain conditions. This may include: Pelvic exam and Pap test. Lipid and cholesterol levels. Diabetes screening. This is done by checking your blood sugar (glucose) after you have not eaten for a while (fasting). Hepatitis B test. Hepatitis C test. HIV (human immunodeficiency virus) test. STI (sexually transmitted infection) testing, if you are at risk. BRCA-related cancer screening. This may be done if you have a family history of breast, ovarian, tubal, or peritoneal cancers. Talk with your health care provider about your test results, treatment options,  and if necessary, the need for more tests. Follow these instructions at home: Eating and drinking  Eat a healthy diet that includes fresh fruits and vegetables, whole grains, lean protein, and low-fat dairy products. Take vitamin and mineral supplements as recommended by your  health care provider. Do not drink alcohol if: Your health care provider tells you not to drink. You are pregnant, may be pregnant, or are planning to become pregnant. If you drink alcohol: Limit how much you have to 0-1 drink a day. Know how much alcohol is in your drink. In the [place] one drink equals one 12 oz bottle of beer (355 mL), one 5 oz glass of wine (148 mL), or one 1 oz glass of hard liquor (44 mL). Lifestyle Brush your teeth every morning and night with fluoride toothpaste. Floss one time each day. Exercise for at least 30 minutes 5 or more days each week. Do not use any products that contain nicotine or tobacco. These products include cigarettes, chewing tobacco, and vaping devices, such as e-cigarettes. If you need help quitting, ask your health care provider. Do not use drugs. If you are sexually active, practice safe sex. Use a condom or other form of protection to prevent STIs. If you do not wish to become pregnant, use a form of birth control. If you plan to become pregnant, see your health care provider for a prepregnancy visit. Find healthy ways to manage stress, such as: Meditation, yoga, or listening to music. Journaling. Talking to a trusted person. Spending time with friends and family. Minimize exposure to UV radiation to reduce your risk of skin cancer. Safety Always wear your seat belt while driving or riding in a vehicle. Do not drive: If you have been drinking alcohol. Do not ride with someone who has been drinking. If you have been using any mind-altering substances or drugs. While texting. When you are tired or distracted. Wear a helmet and other protective equipment during sports activities. If you have firearms in your house, make sure you follow all gun safety procedures. Seek help if you have been physically or sexually abused. What's next? Go to your health care provider once a year for an annual wellness visit. Ask your health care provider how  often you should have your eyes and teeth checked. Stay up to date on all vaccines. This information is not intended to replace advice given to you by your health care provider. Make sure you discuss any questions you have with your health care provider. Document Revised: 02/09/2021 Document Reviewed: 02/09/2021 Elsevier Patient Education  2022 [employer].

## 2023-02-27 ENCOUNTER — Ambulatory Visit: Payer: 59 | Admitting: Family Medicine

## 2023-03-12 ENCOUNTER — Encounter: Payer: Self-pay | Admitting: Family Medicine

## 2023-05-07 ENCOUNTER — Encounter: Payer: Self-pay | Admitting: Psychiatry

## 2023-05-07 ENCOUNTER — Ambulatory Visit: Payer: 59 | Admitting: Psychiatry

## 2023-05-07 NOTE — Progress Notes (Deleted)
Referring:  McDiarmid, Leighton Roach, MD [address] [place],  [place] [address]  PCP: Evette Georges, MD  Neurology was asked to evaluate Sherry Colon, a 27 year old female for a chief complaint of headaches.  Our recommendations of care will be communicated by shared medical record.    CC:  headaches  History provided from ***  HPI:  Medical co-morbidities: asthma, fibromyalgia, Ehlers-Danlos  The patient presents for evaluation of headaches which began***  Headache History: Onset: Triggers: Aura: Location: Quality/Description: Associated Symptoms:  Photophobia:  Phonophobia:  Nausea: Vomiting: Allodynia: Other symptoms: Worse with activity?: Duration of headaches:  Headache days per month: *** Migraine days per month: *** Headache free days per month: ***  Current Treatment: Abortive ***  Preventative ***  Prior Therapies                                 Ibuprofen Tylenol Topamax 25 mg daily Amitriptyline 25 mg at bedtime - drowsiness   LABS: ***  IMAGING:  ***  ***Imaging independently reviewed on May 07, 2023   Current Outpatient Medications on File Prior to Visit  Medication Sig Dispense Refill   albuterol (VENTOLIN HFA) 108 (90 Base) MCG/ACT inhaler Inhale 2 puffs into the lungs every 6 (six) hours as needed for wheezing or shortness of breath. 1 each 0   fluticasone (FLONASE) 50 MCG/ACT nasal spray Place 1 spray into both nostrils daily. 18.2 mL 2   medroxyPROGESTERone (DEPO-PROVERA) 150 MG/ML injection Inject 1 mL (150 mg total) into the muscle every 3 (three) months. 1 mL 4   Prenatal Vit-Fe Fumarate-FA (PREPLUS) 27-1 MG TABS Take 1 tablet by mouth daily. 30 tablet 13   Current Facility-Administered Medications on File Prior to Visit  Medication Dose Route Frequency Provider Last Rate Last Admin   medroxyPROGESTERone (DEPO-PROVERA) injection 150 mg  150 mg Intramuscular Q90 days Constant, Peggy, MD   150 mg at 08/30/22 1620      Allergies: Allergies  Allergen Reactions   Cinnamon     Facial and throat swelling   Montelukast Hives    upper respiratory infection   Orange Oil     Facial and throat swelling   Loratadine     Family History: Migraine or other headaches in the family:  *** Aneurysms in a first degree relative:  *** Brain tumors in the family:  *** Other neurological illness in the family:   ***  Past Medical History: Past Medical History:  Diagnosis Date   Asthma    Chlamydia    Ehlers-Danlos syndrome possible    Fibromyalgia    Headache    Positive GBS test 10/01/2021   Treat in labor   Seizures (HCC)    last seziure when she was in middle school    Past Surgical History Past Surgical History:  Procedure Laterality Date   TONSILLECTOMY      Social History: Social History   Tobacco Use   Smoking status: Never   Smokeless tobacco: Never  Vaping Use   Vaping status: Never Used  Substance Use Topics   Alcohol use: Never   Drug use: Never   ***  ROS: Negative for fevers, chills. Positive for***. All other systems reviewed and negative unless stated otherwise in HPI.   Physical Exam:   Vital Signs: There were no vitals taken for this visit. GENERAL: well appearing,in no acute distress,alert SKIN:  Color, texture, turgor normal. No rashes  or lesions HEAD:  Normocephalic/atraumatic. CV:  RRR RESP: Normal respiratory effort MSK: no tenderness to palpation over occiput, neck, or shoulders  NEUROLOGICAL: Mental Status: Alert, oriented to person, place and time,Follows commands Cranial Nerves: PERRL, visual fields intact to confrontation, extraocular movements intact, facial sensation intact, no facial droop or ptosis, hearing grossly intact, no dysarthria, palate elevate symmetrically, tongue protrudes midline, shoulder shrug intact and symmetric Motor: muscle strength 5/5 both upper and lower extremities,no drift, normal tone Reflexes: 2+ throughout Sensation:  intact to light touch all 4 extremities Coordination: Finger-to- nose-finger intact bilaterally Gait: normal-based   IMPRESSION: ***  PLAN: ***   I spent a total of *** minutes chart reviewing and counseling the patient. Headache education was done. Discussed treatment options including preventive and acute medications, natural supplements, and physical therapy. Discussed medication overuse headache and to limit use of acute treatments to no more than 2 days/week or 10 days/month. Discussed medication side effects, adverse reactions and drug interactions. Written educational materials and patient instructions outlining all of the above were given.  Follow-up: ***   Ocie Doyne, MD 05/07/2023   8:30 AM

## 2023-06-17 NOTE — Progress Notes (Deleted)
Office Visit Note  Patient: Sherry Colon             Date of Birth: 24-Mar-1996           MRN: [id number]             PCP: Evette Georges, MD Referring: McDiarmid, Leighton Roach, MD Visit Date: 06/18/2023 Occupation: @GUAROCC @  Subjective:  No chief complaint on file.   History of Present Illness: Sherry Colon is a 27 y.o. female here for evaluation of chronic joint pain with history of suspected UCTD.***   Labs reviewed 12/2017 ANA neg  Activities of Daily Living:  Patient reports morning stiffness for *** {minute/hour:19697}.   Patient {ACTIONS;DENIES/REPORTS:21021675::"Denies"} nocturnal pain.  Difficulty dressing/grooming: {ACTIONS;DENIES/REPORTS:21021675::"Denies"} Difficulty climbing stairs: {ACTIONS;DENIES/REPORTS:21021675::"Denies"} Difficulty getting out of chair: {ACTIONS;DENIES/REPORTS:21021675::"Denies"} Difficulty using hands for taps, buttons, cutlery, and/or writing: {ACTIONS;DENIES/REPORTS:21021675::"Denies"}  No Rheumatology ROS completed.   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   Chronic tension-type headache, not intractable 01/18/2023   Hypoproteinemia (HCC) 01/18/2023   Anemia 01/18/2023   Chlamydia 01/18/2023   Allergies 01/18/2023   Viral URI with cough 01/18/2023   Postpartum care following vaginal delivery 12/07/2021   Seizures (HCC) 05/04/2021   Fibromyalgia 05/04/2021    Past Medical History:  Diagnosis Date   Asthma    Chlamydia    Ehlers-Danlos syndrome possible    Fibromyalgia    Headache    Positive GBS test 10/01/2021   Treat in labor   Seizures (HCC)    last seziure when she was in middle school    Family History  Problem Relation Age of Onset   Heart disease Mother    Diabetes Mother    Other Mother    Past Surgical History:  Procedure Laterality Date   TONSILLECTOMY     Social History   Social History Narrative   Not on file    Immunization History  Administered Date(s) Administered   HPV 9-valent 01/18/2023   Influenza,inj,Quad PF,6+ Mos 07/07/2021   Moderna Sars-Covid-2 Vaccination 12/18/2019, 01/20/2020, 09/23/2020   Tdap 08/17/2021     Objective: Vital Signs: There were no vitals taken for this visit.   Physical Exam   Musculoskeletal Exam: ***  CDAI Exam: CDAI Score: -- Patient Global: --; Provider Global: -- Swollen: --; Tender: -- Joint Exam 06/18/2023   No joint exam has been documented for this visit   There is currently no information documented on the homunculus. Go to the Rheumatology activity and complete the homunculus joint exam.  Investigation: No additional findings.  Imaging: No results found.  Recent Labs: Lab Results  Component Value Date   WBC 6.8 01/18/2023   HGB 14.3 01/18/2023   PLT 295 01/18/2023   NA 141 01/18/2023   K 4.1 01/18/2023   CL 105 01/18/2023   CO2 23 01/18/2023   GLUCOSE 87 01/18/2023   BUN 15 01/18/2023   CREATININE 0.72 01/18/2023   BILITOT <0.2 01/18/2023   ALKPHOS 91 01/18/2023   AST 13 01/18/2023   ALT 18 01/18/2023   PROT 6.9 01/18/2023   ALBUMIN 4.3 01/18/2023   CALCIUM 9.1 01/18/2023    Speciality Comments: No specialty comments available.  Procedures:  No procedures performed Allergies: Cinnamon, Montelukast, Orange oil, and Loratadine   Assessment / Plan:     Visit Diagnoses: No diagnosis found.  Orders: No orders of the defined types were placed in this encounter.  No orders of the defined  types were placed in this encounter.   Face-to-face time spent with patient was *** minutes. Greater than 50% of time was spent in counseling and coordination of care.  Follow-Up Instructions: No follow-ups on file.   Fuller Plan, MD  Note - This record has been created using [employer].  Chart creation errors have been sought, but may not always  have been located. Such creation errors do not reflect on  the  standard of medical care.

## 2023-06-18 ENCOUNTER — Encounter: Payer: 59 | Admitting: Internal Medicine

## 2023-08-31 DIAGNOSIS — I7300 Raynaud's syndrome without gangrene: Secondary | ICD-10-CM | POA: Diagnosis not present

## 2023-08-31 DIAGNOSIS — R519 Headache, unspecified: Secondary | ICD-10-CM | POA: Diagnosis not present

## 2023-08-31 DIAGNOSIS — Z9089 Acquired absence of other organs: Secondary | ICD-10-CM | POA: Diagnosis not present

## 2023-08-31 DIAGNOSIS — M797 Fibromyalgia: Secondary | ICD-10-CM | POA: Diagnosis not present

## 2023-08-31 DIAGNOSIS — H6523 Chronic serous otitis media, bilateral: Secondary | ICD-10-CM | POA: Diagnosis not present

## 2023-08-31 DIAGNOSIS — Z791 Long term (current) use of non-steroidal anti-inflammatories (NSAID): Secondary | ICD-10-CM | POA: Diagnosis not present

## 2023-08-31 DIAGNOSIS — Z79899 Other long term (current) drug therapy: Secondary | ICD-10-CM | POA: Diagnosis not present

## 2023-08-31 DIAGNOSIS — H60391 Other infective otitis externa, right ear: Secondary | ICD-10-CM | POA: Diagnosis not present

## 2023-08-31 DIAGNOSIS — Z7951 Long term (current) use of inhaled steroids: Secondary | ICD-10-CM | POA: Diagnosis not present

## 2023-08-31 DIAGNOSIS — J45909 Unspecified asthma, uncomplicated: Secondary | ICD-10-CM | POA: Diagnosis not present

## 2023-08-31 DIAGNOSIS — H9203 Otalgia, bilateral: Secondary | ICD-10-CM | POA: Diagnosis not present

## 2024-02-11 DIAGNOSIS — R300 Dysuria: Secondary | ICD-10-CM | POA: Diagnosis not present

## 2024-02-11 DIAGNOSIS — N390 Urinary tract infection, site not specified: Secondary | ICD-10-CM | POA: Diagnosis not present

## 2024-02-11 DIAGNOSIS — J45909 Unspecified asthma, uncomplicated: Secondary | ICD-10-CM | POA: Diagnosis not present

## 2024-02-11 DIAGNOSIS — R350 Frequency of micturition: Secondary | ICD-10-CM | POA: Diagnosis not present

## 2024-02-18 DIAGNOSIS — J029 Acute pharyngitis, unspecified: Secondary | ICD-10-CM | POA: Diagnosis not present

## 2024-02-18 DIAGNOSIS — R062 Wheezing: Secondary | ICD-10-CM | POA: Diagnosis not present

## 2024-02-18 DIAGNOSIS — B349 Viral infection, unspecified: Secondary | ICD-10-CM | POA: Diagnosis not present

## 2024-02-18 DIAGNOSIS — R059 Cough, unspecified: Secondary | ICD-10-CM | POA: Diagnosis not present

## 2024-05-06 DIAGNOSIS — Z13220 Encounter for screening for lipoid disorders: Secondary | ICD-10-CM | POA: Diagnosis not present

## 2024-05-06 DIAGNOSIS — J4520 Mild intermittent asthma, uncomplicated: Secondary | ICD-10-CM | POA: Diagnosis not present

## 2024-05-06 DIAGNOSIS — Z0000 Encounter for general adult medical examination without abnormal findings: Secondary | ICD-10-CM | POA: Diagnosis not present

## 2024-05-06 DIAGNOSIS — M797 Fibromyalgia: Secondary | ICD-10-CM | POA: Diagnosis not present

## 2024-05-06 DIAGNOSIS — Z23 Encounter for immunization: Secondary | ICD-10-CM | POA: Diagnosis not present

## 2024-05-06 DIAGNOSIS — Z131 Encounter for screening for diabetes mellitus: Secondary | ICD-10-CM | POA: Diagnosis not present

## 2024-05-06 DIAGNOSIS — G43909 Migraine, unspecified, not intractable, without status migrainosus: Secondary | ICD-10-CM | POA: Diagnosis not present

## 2024-05-13 DIAGNOSIS — R079 Chest pain, unspecified: Secondary | ICD-10-CM | POA: Diagnosis not present

## 2024-05-13 DIAGNOSIS — J45909 Unspecified asthma, uncomplicated: Secondary | ICD-10-CM | POA: Diagnosis not present

## 2024-05-13 DIAGNOSIS — R059 Cough, unspecified: Secondary | ICD-10-CM | POA: Diagnosis not present

## 2024-05-19 DIAGNOSIS — J4521 Mild intermittent asthma with (acute) exacerbation: Secondary | ICD-10-CM | POA: Diagnosis not present

## 2024-05-19 DIAGNOSIS — R0789 Other chest pain: Secondary | ICD-10-CM | POA: Diagnosis not present

## 2024-07-06 LAB — PANORAMA PRENATAL TEST FULL PANEL:PANORAMA TEST PLUS 5 ADDITIONAL MICRODELETIONS: FETAL FRACTION: 7.3

## 2024-07-23 NOTE — Progress Notes (Signed)
 " [address] [place] [address] Phone: [phone] Fax: [phone] Adult Established - Follow-Up Visit Note  Primary Source of History:patient Primary Language of Patient / Family:English Location of Patient at time of Visit: In Office  CC <redacted file path>: Asthma  Subjective:   History of Present Illness:  Sherry Colon is a 50yrs female with PMHx as noted below who presents today for asthma follow-up.  Using her inhalers twice a day due to SOB and chest tightness. Uses 1-2 puffs at a time. No spacer. Goes to urgent care frequently for breathing treatment. No nocturnal symptoms. Does not take a controller medication at this time. Takes an antihistamine.  Had abortion on 11/22 in [place]. No repeat HCG quant available. Records inaccessible. No persistent or abnormal bleeding. No fever. Asking about depot shot today. Advised that we cannot do that until HCG is downtrending x 2 at least.  Patient's medical records including, imaging, labs, and recent notes were reviewed.  EHR was updated accordingly.   Review of Systems: All other systems were reviewed and are otherwise negative except as stated above.  Problem List[1] Past Medical History[2] Past Surgical History[3] Family History[4] Social History[5] CLICK HERE TO EXPAND MEDICATION LIST[6] Allergies[7]   Objective:   Vital Signs <redacted file path> / Vitals Trends (Review Flow) <redacted file path> BP 111/63 (BP Site: Right arm, Patient Position: Sitting, Cuff Size: Adult)   Pulse 66   Temp 97.7 F (36.5 C) (Temporal)   Resp 18   Wt 147 lb 6.4 oz (66.9 kg)   LMP 04/29/2024 (Approximate)   BMI 26.96 kg/m  Body mass index is 26.96 kg/m.  General:alert, active and in no acute distress HEENT:normocephalic, atraumatic, EOMI bilaterally, sclerae anicteric, mucous membranes moist, OP without lesions or exudate, NP within normal limits, external ears normal Neck:supple, normal range of motion         Cardiovascular: regular rate and rhythm, no murmurs,rubs or gallops Lungs / Chest:clear to auscultation bilaterally, no rales, rhonchi, or wheezes, normal respiratory effort Skin:no rash, normal skin turgor, pigmentation and texture Musculoskeletal: normal, symmetric bulk and tone Neurological: awake, alert, age appropriate affect, behavior and speech, moves all 4 extremities well  Results Review <redacted file path> / Chart Review <redacted file path>  No results found for this or any previous visit (from the past week).  Assessment/Plan by Problem <redacted file path>:   Sherry Colon is a 58yrs female with the following diagnoses:  Assessment & Plan Mild intermittent asthma without complication Intermittent symptoms, not using spacer, not using correct dose consistently. Not on controller medication Orders:   budesonide-formoteroL (Symbicort) 80-4.5 mcg/actuation Inhalation HFA Aerosol Inhaler; Take 2 Puffs by inhalation in the morning and 2 Puffs before bedtime.   inhalational spacing device (BreatheRite MDI Spacer) Misc.(Non-Drug; Combo Route) Spacer; Take 1 Each by inhalation as directed. - Initiate controller - Obtain spacer - Increase albuterol  to 4 puffs q4 PRN - No indication for steroids or antibiotics at this time Abortion in first trimester Self-reported 07/19/2024. At clinic in [place]. No records available. Patient describes what sounds like a D&C procedure. No complications at this time. Orders:   HCG QUANTITATIVE; Future - Obtain HCQ quantitative; would generally expect at 50-80% decrease in the week following abortion; however, we do not have a recent measurement to be reliable. Will obtain today and again in 1 week with lab only visit  - If downtrending at that time, will proceed with Depot Birth control counseling Asking about restarting Depot shot Orders:  HCG QUANTITATIVE; Future - Defer until confirmed successful pregnancy termination; see  above Health maintenance examination  Orders:   albuterol  HFA (PROVENTIL  HFA;VENTOLIN  HFA) 90 mcg/actuation Inhalation HFA Aerosol Inhaler; Take 4 Puffs by inhalation every 4 hours as needed for Wheezing.     Electronically signed by: Dorn Lukes, DO Internal Medicine/Pediatrics PGY-4 [facility] Epic Chat Preferred       [1] Patient Active Problem List Diagnosis Code   Asthma J45.909   Vitamin D deficiency E55.9   Ehlers-Danlos Q79.62   Raynaud's disease I73.00   Polyarthritis M13.0   Migraines G43.909   Fibromyalgia M79.7   Hypoproteinemia (CMS-HCC) E77.8   ?Seizures R56.9   Undifferentiated connective tissue disease M35.9   Prenatal care Z34.90  [2] Past Medical History: Diagnosis Date   ADHD (attention deficit hyperactivity disorder) 02/22/2012   Allergic rhinosinusitis    Allergies 01/18/2023   Last Assessment & Plan:    Took flonase  in the past, and this helped. She needs a refill today.     Anemia 01/18/2023   Last Assessment & Plan:    Last Hgb 11.8 one year ago. She does not endorse any worrisome symptoms. Will obtain repeat today.     Anxiety and depression    Asthma    Attention deficit disorder    Chlamydia 01/18/2023   Last Assessment & Plan:    Tested positive (after first round of treatment) on 11/04/2022. Given doxycyline at that time. Unable to return for [facility] given her baby was sick. Obtained self-swab today for TOC. Will f/u results.     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   pt unsure of stage   Febrile seizure (CMS-HCC) 2019   when i get too hot   Fibromyalgia    GERD (gastroesophageal reflux disease) 03/27/2013   High-risk pregnancy in first trimester 06/18/2024   Menorrhagia 02/22/2012   Migraine without aura and without status migrainosus, not intractable 04/20/2015   Myofascial pain syndrome, cervical 01/24/2016   Postpartum care following vaginal delivery 12/07/2021   Pregnancy  06/04/2024   RAD (reactive airway disease)    Raynaud's disease without gangrene 08/04/2015   Seasonal allergies 04/20/2015   Viral URI with cough 01/18/2023   Last Assessment & Plan:    Improving over time. Still has cough. Exam findings of conjunctival hemorrhage could be due to cough. Reassured by lack of visual difficulties. Should continue to improve, along with the cough, over time. Will follow up at next visit.    [3] Past Surgical History: Procedure Laterality Date   SUR - TONSILLECTOMY  2013  [4] Family History Problem Relation Name Age of Onset   Heart Mother Sharene    Hypertension Mother Sharene    Diabetes Mother Sharene    Asthma Sister Recardo    Birth Defects Sister Princess    Seizure Sister Princess    Other - Specify in Comments Other         epilepsy  [5] Social History Tobacco Use   Smoking status: Never   Smokeless tobacco: Never   Tobacco comments:    no smoking in the house  Vaping Use   Vaping status: Never Used  Substance Use Topics   Alcohol use: Never   Drug use: No  [6] Current Outpatient Medications  Medication Sig Dispense Refill   budesonide-formoteroL (Symbicort) 80-4.5 mcg/actuation Inhalation HFA Aerosol Inhaler Take 2 Puffs by inhalation in the morning and 2 Puffs before bedtime. 10.2 g 3   inhalational spacing device (BreatheRite MDI Spacer)  Misc.(Non-Drug; Combo Route) Spacer Take 1 Each by inhalation as directed. 1 Each 1   albuterol  HFA (PROVENTIL  HFA;VENTOLIN  HFA) 90 mcg/actuation Inhalation HFA Aerosol Inhaler Take 2 Puffs by inhalation every 4 hours as needed for Wheezing. 18 g 3   amitriptyline (ELAVIL) 10 mg Oral Tablet Take 1 Tablet (10 mg total) by mouth at bedtime. 30 Tablet 0   fluticasone  propionate (FLONASE ) 50 mcg/actuation Nasal Spray, Suspension 1 Spray by Nasal route in the morning.     ibuprofen  (ADVIL ,MOTRIN ) 600 mg Oral Tablet Take 1 Tablet by mouth every 8 hours as needed for Pain. 30 Tablet 0    ondansetron  ODT (ZOFRAN -ODT) 4 mg Oral Tablet, Rapid Dissolve Take 1 Tablet (4 mg total) by mouth every 8 hours as needed for Nausea. 30 Tablet 3   pentoxifylline (TRENtal) 400 mg Oral Tablet Sustained Release Take 1 Tablet by mouth twice a day after meals. For circulation. (Patient not taking: Reported on 07/01/2024) 60 Tablet 2   prenatal multivit-Ca-min-Fe-FA (PRENATAL VITAMIN) Oral Tablet Take 1 Tab by mouth daily. 60 Tab 0   raNITIdine (ZANTAC) 150 mg Oral Tablet Take 1 Tab by mouth twice a day. 60 Tab 1   No current facility-administered medications for this visit.  [7] Allergies Allergen Reactions   Cinnamon     Facial and throat swelling   Orange     Facial and throat swelling   Singulair [Montelukast] Hives    upper respiratory infection  "

## 2024-07-29 NOTE — Progress Notes (Signed)
 Date of visit: 07/29/2024 Patient name: Sherry Colon Medical record number:  [id number]   Problem List <redacted file path> Assessment & Plan Depo-Provera  contraceptive status -previous DMPA 05/06/24 -suspect she conceived around the time she received that injection -she desires to continue same after discussion of various options Orders:   medroxyPROGESTERone  (DEPO-PROVERA ) injection 150 mg  Need for HPV vaccination -this is her second dose (1st in 2024) -next dose in 4 months Orders:   HPV VACCINE, NONAVALENT, TYPES 6,9,11, 16, 18, 31, 33, 45, 52, 58, 3 DOSE, IM W/CPT (09348)  LGSIL on Pap smear of cervix -reviewed her previous pap results (LSIL/+HPV other) -she is scheduled for colpo in February -reviewed colpo procedure      Subjective Sherry Colon is a 42yrs [phone] who presents for eval following Tab Underwent D&C 07/19/24 at [facility] resolved now for 4 days She desires DMPA for contraception, last DMPA provided on 05/06/24  Medications Ordered Prior to Encounter[1] Allergies[2]  Objective Vital Signs <redacted file path>  BP 124/79   Pulse 73   Ht 5' 2 (1.575 m)   Wt 149 lb 4.8 oz (67.7 kg)   LMP 04/29/2024 (Approximate)   BMI 27.31 kg/m    General alert, no distress, cooperative Lung No increased work of breathing Extremities No deformities, No skin discoloration, No edema Neuro Awake, alert and oriented x 3       Component Ref Range & Units (hover) 6 d ago 1 mo ago  HCG 83 79,366 <redacted file path> CM        Component Ref Range & Units (hover) 4 wk ago  ABO GROUP B  RH TYPE Positive  AB SCREEN Negative    Charlie Tanda Lites, MD [facility]  Follow Up <redacted file path> No follow-ups on file.    For patients reading this note: This documentation allows healthcare providers to keep track of your care including treatment plan and progress. The purpose of this note is not to provide patient education; notes  are for your healthcare team to recall for future references. This note may contain medical terms, abbreviations, and codes that you do not understand, as notes are written for medically trained professionals.   Future Appointments  Date Time Provider Department Center  07/30/2024  8:30 AM [facility]  10/01/2024  8:00 AM Maranda Eck, MD [facility]  10/07/2024  4:00 PM [facility] NURSE [facility]  10/23/2024 10:40 AM Katrinka Dorn Sharper, DO [facility]          [1] Current Outpatient Medications on File Prior to Visit  Medication Sig Dispense Refill   albuterol  HFA (PROVENTIL  HFA;VENTOLIN  HFA) 90 mcg/actuation Inhalation HFA Aerosol Inhaler Take 4 Puffs by inhalation every 4 hours as needed for Wheezing. 18 g 3   amitriptyline (ELAVIL) 10 mg Oral Tablet Take 1 Tablet (10 mg total) by mouth at bedtime. 30 Tablet 0   budesonide-formoteroL (Symbicort) 80-4.5 mcg/actuation Inhalation HFA Aerosol Inhaler Take 2 Puffs by inhalation in the morning and 2 Puffs before bedtime. 10.2 g 3   fluticasone  propionate (FLONASE ) 50 mcg/actuation Nasal Spray, Suspension 1 Spray by Nasal route in the morning.     ibuprofen  (ADVIL ,MOTRIN ) 600 mg Oral Tablet Take 1 Tablet by mouth every 8 hours as needed for Pain. 30 Tablet 0   inhalational spacing device (BreatheRite MDI Spacer) Misc.(Non-Drug; Combo Route) Spacer Take 1 Each by inhalation as directed. 1 Each 1   ondansetron  ODT (  ZOFRAN -ODT) 4 mg Oral Tablet, Rapid Dissolve Take 1 Tablet (4 mg total) by mouth every 8 hours as needed for Nausea. 30 Tablet 3   pentoxifylline (TRENtal) 400 mg Oral Tablet Sustained Release Take 1 Tablet by mouth twice a day after meals. For circulation. 60 Tablet 2   raNITIdine (ZANTAC) 150 mg Oral Tablet Take 1 Tab by mouth twice a day. 60 Tab 1   [DISCONTINUED] prenatal multivit-Ca-min-Fe-FA (PRENATAL VITAMIN) Oral Tablet Take 1 Tab by mouth daily. 60 Tab 0   No current  facility-administered medications on file prior to visit.  [2] Allergies Allergen Reactions   Cinnamon     Facial and throat swelling   Orange     Facial and throat swelling   Singulair [Montelukast] Hives    upper respiratory infection

## 2024-09-24 ENCOUNTER — Encounter: Payer: Self-pay | Admitting: Family Medicine

## 2024-09-24 DIAGNOSIS — M359 Systemic involvement of connective tissue, unspecified: Secondary | ICD-10-CM

## 2024-09-24 DIAGNOSIS — M2550 Pain in unspecified joint: Secondary | ICD-10-CM

## 2024-09-24 DIAGNOSIS — M797 Fibromyalgia: Secondary | ICD-10-CM

## 2024-09-25 ENCOUNTER — Encounter: Payer: Self-pay | Admitting: Family Medicine

## 2024-09-25 NOTE — Addendum Note (Signed)
 Addended by: THARON LUNG B on: 09/25/2024 08:35 AM   Modules accepted: Orders

## 2024-10-13 ENCOUNTER — Ambulatory Visit: Admitting: Family Medicine
